# Patient Record
Sex: Male | Born: 1955 | Race: White | Hispanic: No | Marital: Married | Smoking: Former smoker
Health system: Southern US, Community
[De-identification: ages and names within clinical notes are randomized; demographics above are authoritative.]

## PROBLEM LIST (undated history)

## (undated) DIAGNOSIS — K219 Gastro-esophageal reflux disease without esophagitis: Secondary | ICD-10-CM

## (undated) DIAGNOSIS — E785 Hyperlipidemia, unspecified: Secondary | ICD-10-CM

## (undated) DIAGNOSIS — I10 Essential (primary) hypertension: Secondary | ICD-10-CM

## (undated) HISTORY — PX: TONSILLECTOMY: SUR1361

## (undated) HISTORY — DX: Hyperlipidemia, unspecified: E78.5

## (undated) HISTORY — DX: Essential (primary) hypertension: I10

---

## 2005-09-10 ENCOUNTER — Emergency Department: Payer: Self-pay | Admitting: Emergency Medicine

## 2007-01-04 HISTORY — PX: SHOULDER SURGERY: SHX246

## 2011-10-07 ENCOUNTER — Ambulatory Visit: Payer: Self-pay | Admitting: Medical

## 2011-10-07 LAB — DOT URINE DIP: Specific Gravity: 1.005 (ref 1.003–1.030)

## 2012-02-20 ENCOUNTER — Ambulatory Visit: Payer: Self-pay

## 2012-09-06 ENCOUNTER — Ambulatory Visit: Payer: Self-pay

## 2012-09-10 ENCOUNTER — Ambulatory Visit: Payer: Self-pay

## 2012-09-10 LAB — DOT URINE DIP
Protein: NEGATIVE
Specific Gravity: 1.02 (ref 1.003–1.030)

## 2013-08-19 ENCOUNTER — Ambulatory Visit: Payer: Self-pay | Admitting: Physician Assistant

## 2013-08-19 LAB — DOT URINE DIP
Blood: NEGATIVE
GLUCOSE, UR: NEGATIVE
Protein: NEGATIVE
Specific Gravity: 1.01 (ref 1.000–1.030)

## 2014-07-01 ENCOUNTER — Ambulatory Visit (INDEPENDENT_AMBULATORY_CARE_PROVIDER_SITE_OTHER): Payer: BLUE CROSS/BLUE SHIELD | Admitting: Unknown Physician Specialty

## 2014-07-01 ENCOUNTER — Encounter: Payer: Self-pay | Admitting: Unknown Physician Specialty

## 2014-07-01 VITALS — BP 163/95 | HR 53 | Temp 97.4°F | Ht 68.2 in | Wt 205.0 lb

## 2014-07-01 DIAGNOSIS — R7301 Impaired fasting glucose: Secondary | ICD-10-CM | POA: Insufficient documentation

## 2014-07-01 DIAGNOSIS — E785 Hyperlipidemia, unspecified: Secondary | ICD-10-CM

## 2014-07-01 DIAGNOSIS — N183 Chronic kidney disease, stage 3 unspecified: Secondary | ICD-10-CM | POA: Insufficient documentation

## 2014-07-01 DIAGNOSIS — K429 Umbilical hernia without obstruction or gangrene: Secondary | ICD-10-CM

## 2014-07-01 DIAGNOSIS — I159 Secondary hypertension, unspecified: Secondary | ICD-10-CM

## 2014-07-01 DIAGNOSIS — N529 Male erectile dysfunction, unspecified: Secondary | ICD-10-CM | POA: Insufficient documentation

## 2014-07-01 DIAGNOSIS — E8881 Metabolic syndrome: Secondary | ICD-10-CM

## 2014-07-01 DIAGNOSIS — I1310 Hypertensive heart and chronic kidney disease without heart failure, with stage 1 through stage 4 chronic kidney disease, or unspecified chronic kidney disease: Secondary | ICD-10-CM | POA: Insufficient documentation

## 2014-07-01 NOTE — Progress Notes (Signed)
   BP 163/95 mmHg  Pulse 53  Temp(Src) 97.4 F (36.3 C)  Ht 5' 8.2" (1.732 m)  Wt 205 lb (92.987 kg)  BMI 31.00 kg/m2  SpO2 99%   Subjective:    Patient ID: Brent Smith, male    DOB: 11/19/1955, 59 y.o.   MRN: [id number]  HPI: Brent Smith is a 59 y.o. male  Chief Complaint  Patient presents with  . Umbilical Hernia    pt states it has been there for about 5 weeks now, pt states he feels a little discomfort but not much pain   Pt with umbilical hernia that his wife is concerned about.  No bowel or bladder problems.  No pain other than when he presses on it.    Hypertension: Pt is having BP checked at home by his daughter.  It is generally runs 130's/80s.  Compliance is good  Relevant past medical, surgical, family and social history reviewed and updated as indicated. Interim medical history since our last visit reviewed. Allergies and medications reviewed and updated.  Review of Systems  Per HPI unless specifically indicated above     Objective:    BP 163/95 mmHg  Pulse 53  Temp(Src) 97.4 F (36.3 C)  Ht 5' 8.2" (1.732 m)  Wt 205 lb (92.987 kg)  BMI 31.00 kg/m2  SpO2 99%  Wt Readings from Last 3 Encounters:  07/01/14 205 lb (92.987 kg)  07/01/14 205 lb (92.987 kg)    Physical Exam  Constitutional: He is oriented to person, place, and time. He appears well-developed and well-nourished. No distress.  HENT:  Head: Normocephalic and atraumatic.  Eyes: Conjunctivae and lids are normal. Right eye exhibits no discharge. Left eye exhibits no discharge. No scleral icterus.  Cardiovascular: Normal rate and regular rhythm.   Pulmonary/Chest: Effort normal. No respiratory distress.  Abdominal: Soft. Normal appearance and bowel sounds are normal. He exhibits no distension. There is no splenomegaly or hepatomegaly. There is no tenderness.  Small ubilical hernia noted.    Musculoskeletal: Normal range of motion.  Neurological: He is alert and oriented to person,  place, and time.  Skin: Skin is intact. No rash noted. No pallor.  Psychiatric: He has a normal mood and affect. His behavior is normal. Judgment and thought content normal.       Assessment & Plan:   Problem List Items Addressed This Visit      Other   Hyperlipidemia    Home readings have been good       Other Visit Diagnoses    Umbilical hernia without obstruction and without gangrene    -  Primary        Follow up plan: Return if symptoms worsen or fail to improve.   Keep an eye on BP.  He states he will keep a regular check on it.   If persistantly high he will come back and see me.  Regular appointment in October.

## 2014-07-01 NOTE — Assessment & Plan Note (Signed)
Home readings have been good

## 2014-08-11 ENCOUNTER — Encounter: Payer: Self-pay | Admitting: Emergency Medicine

## 2014-08-11 ENCOUNTER — Ambulatory Visit
Admission: EM | Admit: 2014-08-11 | Discharge: 2014-08-11 | Disposition: A | Discharge status: Home / Self Care | Payer: Self-pay | Attending: Family Medicine | Admitting: Family Medicine

## 2014-08-11 DIAGNOSIS — E785 Hyperlipidemia, unspecified: Secondary | ICD-10-CM

## 2014-08-11 DIAGNOSIS — E669 Obesity, unspecified: Secondary | ICD-10-CM

## 2014-08-11 DIAGNOSIS — I10 Essential (primary) hypertension: Secondary | ICD-10-CM

## 2014-08-11 DIAGNOSIS — Z029 Encounter for administrative examinations, unspecified: Secondary | ICD-10-CM

## 2014-08-11 LAB — DEPT OF TRANSP DIPSTICK, URINE (ARMC ONLY)
Glucose, UA: NEGATIVE mg/dL
Hgb urine dipstick: NEGATIVE
Protein, ur: NEGATIVE mg/dL
Specific Gravity, Urine: 1.015 (ref 1.005–1.030)

## 2014-08-11 NOTE — ED Provider Notes (Signed)
CSN: [id number]     Arrival date & time 08/11/14  0754 History   First MD Initiated Contact with Patient 08/11/14 [phone]     Chief Complaint  Patient presents with  . 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  Saw PCM last month for umbilical hernia - resolved per patient denied pain, change in bowel habits.  Taking hypertension medications as prescribed and blood pressure has been good.  Next PCM follow up scheduled Oct 2015.  Has been on vacation at the beach this past week did not work out per his usual and gained 5 lbs.  Saw optometry last month and received new contact/glasses Rx on order minor adjustments.  Carries spare pair in truck with him  Works for [employer] typical shift 3 days on 3 off drives from [place] to [place], [place].  Denied headaches, chest pain, visual loss, seasonal allergies, dyspnea, shortness of breath.  Alcohol consumption 1-2 times per month 2-3 beers or 1 mixed drink per sitting when at their beach house not when on the road.  The history is provided by the patient.    Past Medical History  Diagnosis Date  . Hyperlipidemia   . Hypertension    Past Surgical History  Procedure Laterality Date  . Shoulder surgery  2009  . Tonsillectomy     Family History  Problem Relation Age of Onset  . Hypertension Mother   . Parkinson's disease Mother   . Hypertension Father   . Dementia Father   . Hypertension Sister   . Hypertension Brother   . Hypertension Maternal Grandfather    History  Substance Use Topics  . Smoking status: Former Games developer  . Smokeless tobacco: Never Used  . Alcohol Use: Yes     Comment: pt states on occasion or on vacation    Review of Systems  Constitutional: Negative for fever, chills, diaphoresis, activity change, appetite change and fatigue.  HENT: Negative for congestion, dental problem, drooling, ear  discharge, ear pain, facial swelling, hearing loss, mouth sores, nosebleeds, postnasal drip, rhinorrhea, sinus pressure, sneezing, sore throat, trouble swallowing and voice change.   Eyes: Negative for photophobia, pain, discharge, redness, itching and visual disturbance.  Respiratory: Negative for cough, choking, chest tightness, shortness of breath, wheezing and stridor.   Cardiovascular: Negative for chest pain, palpitations and leg swelling.  Gastrointestinal: Negative for nausea, vomiting, abdominal pain, diarrhea, constipation, blood in stool, abdominal distention and anal bleeding.  Endocrine: Negative for cold intolerance and heat intolerance.  Genitourinary: Negative for dysuria, urgency, frequency, hematuria, flank pain, decreased urine volume, enuresis, difficulty urinating and genital sores.  Musculoskeletal: Negative for myalgias, back pain, joint swelling, arthralgias, gait problem, neck pain and neck stiffness.  Skin: Negative for color change, pallor, rash and wound.  Allergic/Immunologic: Negative for environmental allergies and food allergies.  Neurological: Negative for dizziness, tremors, seizures, syncope, facial asymmetry, speech difficulty, weakness, light-headedness, numbness and headaches.  Hematological: Negative for adenopathy. Does not bruise/bleed easily.  Psychiatric/Behavioral: Negative for behavioral problems, confusion, sleep disturbance and agitation.    Allergies  Review of patient's allergies indicates no known allergies.  Home Medications   Prior to Admission medications   Medication Sig Start Date End Date Taking? Authorizing Provider  aspirin 81 MG tablet Take 81 mg by mouth daily.    Historical Provider, MD  atorvastatin (LIPITOR) 40 MG tablet Take 40 mg by mouth daily.    Historical Provider, MD  benazepril (  LOTENSIN) 10 MG tablet Take 10 mg by mouth daily.    Historical Provider, MD   BP 144/96 mmHg  Pulse 72  Temp(Src) 96.9 F (36.1 C)  (Tympanic)  Resp 18  Ht 5\' 9"  (1.753 m)  Wt 206 lb (93.441 kg)  BMI 30.41 kg/m2  SpO2 99% Physical Exam  Constitutional: He is oriented to person, place, and time. Vital signs are normal. He appears well-developed and well-nourished. No distress.  HENT:  Head: Normocephalic and atraumatic.  Right Ear: Hearing, tympanic membrane, external ear and ear canal normal.  Left Ear: Hearing, tympanic membrane, external ear and ear canal normal.  Nose: Nose normal. No mucosal edema or rhinorrhea. Right sinus exhibits no maxillary sinus tenderness and no frontal sinus tenderness. Left sinus exhibits no maxillary sinus tenderness and no frontal sinus tenderness.  Mouth/Throat: Uvula is midline and oropharynx is clear and moist. Mucous membranes are not pale, dry and not cyanotic. He does not have dentures. No oral lesions. No trismus in the jaw. Normal dentition. No dental abscesses, uvula swelling, lacerations or dental caries. No oropharyngeal exudate, posterior oropharyngeal edema, posterior oropharyngeal erythema or tonsillar abscesses.  Tongue white coating mucous membranes dry patient has been NPO this am  Eyes: Conjunctivae, EOM and lids are normal. Pupils are equal, round, and reactive to light. Right eye exhibits no chemosis, no discharge, no exudate and no hordeolum. No foreign body present in the right eye. Left eye exhibits no chemosis, no discharge, no exudate and no hordeolum. No foreign body present in the left eye. Right conjunctiva is not injected. Right conjunctiva has no hemorrhage. Left conjunctiva is not injected. Left conjunctiva has no hemorrhage. No scleral icterus. Right eye exhibits normal extraocular motion and no nystagmus. Left eye exhibits normal extraocular motion and no nystagmus.  Neck: Trachea normal and normal range of motion. Neck supple. No tracheal deviation present. No thyromegaly present.  Cardiovascular: Normal rate, regular rhythm, S1 normal, S2 normal, normal heart  sounds, intact distal pulses and normal pulses.  Exam reveals no gallop, no friction rub and no decreased pulses.   No murmur heard. Pulmonary/Chest: Effort normal and breath sounds normal. No stridor. No respiratory distress. He has no decreased breath sounds. He has no wheezes. He has no rhonchi. He has no rales. He exhibits no tenderness.  Abdominal: Soft. Bowel sounds are normal. He exhibits no shifting dullness, no distension, no pulsatile liver, no fluid wave, no abdominal bruit, no ascites, no pulsatile midline mass and no mass. There is no hepatosplenomegaly. There is no tenderness. There is no rigidity, no rebound, no guarding, no CVA tenderness, no tenderness at McBurney's point and negative Murphy's sign. Hernia confirmed negative in the ventral area, confirmed negative in the right inguinal area and confirmed negative in the left inguinal area.  Dull to percussion x 4 quads  Genitourinary: Penis normal. No penile tenderness.  Musculoskeletal: Normal range of motion. He exhibits no tenderness.       Right shoulder: Normal.       Left shoulder: Normal.       Right elbow: Normal.      Left elbow: Normal.       Right wrist: Normal.       Left wrist: Normal.       Right hip: Normal.       Left hip: Normal.       Right knee: Normal.       Left knee: Normal.       Right ankle: Normal.  Left ankle: Normal.       Cervical back: Normal.       Thoracic back: Normal.       Lumbar back: Normal.       Right upper arm: Normal.       Left upper arm: Normal.       Right forearm: Normal.       Left forearm: Normal.       Right hand: Normal.       Left hand: Normal.       Right upper leg: Normal.       Left upper leg: Normal.       Right lower leg: Normal.       Left lower leg: Normal.       Right foot: Normal.       Left foot: Normal.  Lymphadenopathy:    He has no cervical adenopathy.  Neurological: He is alert and oriented to person, place, and time. He has normal reflexes. He  displays normal reflexes. No cranial nerve deficit. He exhibits normal muscle tone. Coordination normal.  Skin: Skin is warm, dry and intact. No rash noted. He is not diaphoretic. No erythema. No pallor.  Psychiatric: He has a normal mood and affect. His speech is normal and behavior is normal. Judgment and thought content normal. Cognition and memory are normal.  Nursing note and vitals reviewed.   ED Course  Procedures (including critical care time) Labs Review Labs Reviewed  DEPT OF TRANSP DIPSTICK, URINE([facility] ONLY)    Imaging Review No results found. Repeat BP 141/78 hr 63  Patient hungry will go to eat breakfast and return for repeat blood pressure.  Discussed no tobacco, no caffeine, no added salt for breakfast food choices.  Patient verbalized understanding of information/instructions, agreed with plan of care and departed ambulatory at 0945 in NAD gait sure and steady in hallway.  Repeat bp at 1250 140/87 and 65 HR.  Discussed patient to lose 5 lbs over next month back to his baseline weight.  Follow up with [facility] as scheduled Oct 2016.  Patient verbalized understanding of information/instructions, agreed with plan of care and had no further questions at this time.   Visual Acuity  Right Eye Distance:  corrected20/20 Left Eye Distance: 20\20 corrected Bilateral Distance:  20/20 corrected  Right Eye Near:   Left Eye Near:    Bilateral Near:    MDM   1. Essential hypertension   2. Hyperlipidemia   3. Obesity (BMI 30-39.9)    Information entered in [employer] given 1 year certificate due to hypertension history.  See copies in record manager for [facility] 5875, [id number].  Patient instructed to follow up for re-evaluation in 1 year sooner if changes in his health status e.g. diagnosis chronic disease, surgery, hospitalization.  Discussed with patient urinalysis was normal.  Patient verbalized understanding of information/instructions, agreed with plan of care and  had no further questions at this time.  Continue current medications as directed.  Continue to monitor blood pressure at home and maintain log of blood pressure and pulse to bring to follow up appointments.  Continue low sodium diet and exercise program.  Recommended weight loss/weight maintenance to BMI 20-25.  5 lb weight loss will get him to overweight category and 35 lbs weight loss over next year will get him to normal weight category.  Discussed 150 minutes exercise per week continue gym membership.  Follow up as scheduled with PCM in October for routine re-evaluation HTN/hyperlipidemia.  Return  to the clinic if any new symptoms.  Patient verbalized agreement and understanding of treatment plan and had no further questions at this time.   P2:  Diet and Exercise specific for HTN   Barbaraann Barthel, NP 08/11/14 1304

## 2014-08-11 NOTE — ED Notes (Signed)
Dot physical 

## 2014-08-11 NOTE — Discharge Instructions (Signed)
Cholesterol Cholesterol is a white, waxy, fat-like substance needed by your body in small amounts. The liver makes all the cholesterol you need. Cholesterol is carried from the liver by the blood through the blood vessels. Deposits of cholesterol (plaque) may build up on blood vessel walls. These make the arteries narrower and stiffer. Cholesterol plaques increase the risk for heart attack and stroke.  You cannot feel your cholesterol level even if it is very high. The only way to know it is high is with a blood test. Once you know your cholesterol levels, you should keep a record of the test results. Work with your health care provider to keep your levels in the desired range.  WHAT DO THE RESULTS MEAN?  Total cholesterol is a rough measure of all the cholesterol in your blood.   LDL is the so-called bad cholesterol. This is the type that deposits cholesterol in the walls of the arteries. You want this level to be low.   HDL is the good cholesterol because it cleans the arteries and carries the LDL away. You want this level to be high.  Triglycerides are fat that the body can either burn for energy or store. High levels are closely linked to heart disease.  WHAT ARE THE DESIRED LEVELS OF CHOLESTEROL?  Total cholesterol below 200.   LDL below 100 for people at risk, below 70 for those at very high risk.   HDL above 50 is good, above 60 is best.   Triglycerides below 150.  HOW CAN I LOWER MY CHOLESTEROL?  Diet. Follow your diet programs as directed by your health care provider.   Choose fish or white meat chicken and [place], roasted or baked. Limit fatty cuts of red meat, fried foods, and processed meats, such as sausage and lunch meats.   Eat lots of fresh fruits and vegetables.  Choose whole grains, beans, pasta, potatoes, and cereals.   Use only small amounts of olive, corn, or canola oils.   Avoid butter, mayonnaise, shortening, or palm kernel oils.  Avoid foods with  trans fats.   Drink skim or nonfat milk and eat low-fat or nonfat yogurt and cheeses. Avoid whole milk, cream, ice cream, egg yolks, and full-fat cheeses.   Healthy desserts include angel food cake, ginger snaps, animal crackers, hard candy, popsicles, and low-fat or nonfat frozen yogurt. Avoid pastries, cakes, pies, and cookies.   Exercise. Follow your exercise programs as directed by your health care provider.   A regular program helps decrease LDL and raise HDL.   A regular program helps with weight control.   Do things that increase your activity level like gardening, walking, or taking the stairs. Ask your health care provider about how you can be more active in your daily life.   Medicine. Take medicine only as directed by your health care provider.   Medicine may be prescribed by your health care provider to help lower cholesterol and decrease the risk for heart disease.   If you have several risk factors, you may need medicine even if your levels are normal. Document Released: 09/14/2000 Document Revised: 05/06/2013 Document Reviewed: 10/03/2012 [facility] Patient Information 2015 [place], [place]. This information is not intended to replace advice given to you by your health care provider. Make sure you discuss any questions you have with your health care provider.  DASH Eating Plan DASH stands for "Dietary Approaches to Stop Hypertension." The DASH eating plan is a healthy eating plan that has been shown to reduce high  blood pressure (hypertension). Additional health benefits may include reducing the risk of type 2 diabetes mellitus, heart disease, and stroke. The DASH eating plan may also help with weight loss. WHAT DO I NEED TO KNOW ABOUT THE DASH EATING PLAN? For the DASH eating plan, you will follow these general guidelines:  Choose foods with a percent daily value for sodium of less than 5% (as listed on the food label).  Use salt-free seasonings or herbs instead of  table salt or sea salt.  Check with your health care provider or pharmacist before using salt substitutes.  Eat lower-sodium products, often labeled as "lower sodium" or "no salt added."  Eat fresh foods.  Eat more vegetables, fruits, and low-fat dairy products.  Choose whole grains. Look for the word "whole" as the first word in the ingredient list.  Choose fish and skinless chicken or [place] more often than red meat. Limit fish, poultry, and meat to 6 oz (170 g) each day.  Limit sweets, desserts, sugars, and sugary drinks.  Choose heart-healthy fats.  Limit cheese to 1 oz (28 g) per day.  Eat more home-cooked food and less restaurant, buffet, and fast food.  Limit fried foods.  Cook foods using methods other than frying.  Limit canned vegetables. If you do use them, rinse them well to decrease the sodium.  When eating at a restaurant, ask that your food be prepared with less salt, or no salt if possible. WHAT FOODS CAN I EAT? Seek help from a dietitian for individual calorie needs. Grains Whole grain or whole wheat bread. Brown rice. Whole grain or whole wheat pasta. Quinoa, bulgur, and whole grain cereals. Low-sodium cereals. Corn or whole wheat flour tortillas. Whole grain cornbread. Whole grain crackers. Low-sodium crackers. Vegetables Fresh or frozen vegetables (raw, steamed, roasted, or grilled). Low-sodium or reduced-sodium tomato and vegetable juices. Low-sodium or reduced-sodium tomato sauce and paste. Low-sodium or reduced-sodium canned vegetables.  Fruits All fresh, canned (in natural juice), or frozen fruits. Meat and Other Protein Products Ground beef (85% or leaner), grass-fed beef, or beef trimmed of fat. Skinless chicken or [place]. Ground chicken or [place]. Pork trimmed of fat. All fish and seafood. Eggs. Dried beans, peas, or lentils. Unsalted nuts and seeds. Unsalted canned beans. Dairy Low-fat dairy products, such as skim or 1% milk, 2% or reduced-fat  cheeses, low-fat ricotta or cottage cheese, or plain low-fat yogurt. Low-sodium or reduced-sodium cheeses. Fats and Oils Tub margarines without trans fats. Light or reduced-fat mayonnaise and salad dressings (reduced sodium). Avocado. Safflower, olive, or canola oils. Natural peanut or almond butter. Other Unsalted popcorn and pretzels. The items listed above may not be a complete list of recommended foods or beverages. Contact your dietitian for more options. WHAT FOODS ARE NOT RECOMMENDED? Grains White bread. White pasta. White rice. Refined cornbread. Bagels and croissants. Crackers that contain trans fat. Vegetables Creamed or fried vegetables. Vegetables in a cheese sauce. Regular canned vegetables. Regular canned tomato sauce and paste. Regular tomato and vegetable juices. Fruits Dried fruits. Canned fruit in light or heavy syrup. Fruit juice. Meat and Other Protein Products Fatty cuts of meat. Ribs, chicken wings, bacon, sausage, bologna, salami, chitterlings, fatback, hot dogs, bratwurst, and packaged luncheon meats. Salted nuts and seeds. Canned beans with salt. Dairy Whole or 2% milk, cream, half-and-half, and cream cheese. Whole-fat or sweetened yogurt. Full-fat cheeses or blue cheese. Nondairy creamers and whipped toppings. Processed cheese, cheese spreads, or cheese curds. Condiments Onion and garlic salt, seasoned salt, table salt, and  sea salt. Canned and packaged gravies. Worcestershire sauce. Tartar sauce. Barbecue sauce. Teriyaki sauce. Soy sauce, including reduced sodium. Steak sauce. Fish sauce. Oyster sauce. Cocktail sauce. Horseradish. Ketchup and mustard. Meat flavorings and tenderizers. Bouillon cubes. Hot sauce. Tabasco sauce. Marinades. Taco seasonings. Relishes. Fats and Oils Butter, stick margarine, lard, shortening, ghee, and bacon fat. Coconut, palm kernel, or palm oils. Regular salad dressings. Other Pickles and olives. Salted popcorn and pretzels. The items  listed above may not be a complete list of foods and beverages to avoid. Contact your dietitian for more information. WHERE CAN I FIND MORE INFORMATION? National Heart, Lung, and Blood Institute: [employer] Document Released: 12/09/2010 Document Revised: 05/06/2013 Document Reviewed: 10/24/2012 [facility] Patient Information 2015 [place], [place]. This information is not intended to replace advice given to you by your health care provider. Make sure you discuss any questions you have with your health care provider. Hypertension Hypertension, commonly called high blood pressure, is when the force of blood pumping through your arteries is too strong. Your arteries are the blood vessels that carry blood from your heart throughout your body. A blood pressure reading consists of a higher number over a lower number, such as 110/72. The higher number (systolic) is the pressure inside your arteries when your heart pumps. The lower number (diastolic) is the pressure inside your arteries when your heart relaxes. Ideally you want your blood pressure below 120/80. Hypertension forces your heart to work harder to pump blood. Your arteries may become narrow or stiff. Having hypertension puts you at risk for heart disease, stroke, and other problems.  RISK FACTORS Some risk factors for high blood pressure are controllable. Others are not.  Risk factors you cannot control include:   Race. You may be at higher risk if you are African American.  Age. Risk increases with age.  Gender. Men are at higher risk than women before age 12 years. After age 26, women are at higher risk than men. Risk factors you can control include:  Not getting enough exercise or physical activity.  Being overweight.  Getting too much fat, sugar, calories, or salt in your diet.  Drinking too much alcohol. SIGNS AND SYMPTOMS Hypertension does not usually cause signs or symptoms. Extremely high  blood pressure (hypertensive crisis) may cause headache, anxiety, shortness of breath, and nosebleed. DIAGNOSIS  To check if you have hypertension, your health care provider will measure your blood pressure while you are seated, with your arm held at the level of your heart. It should be measured at least twice using the same arm. Certain conditions can cause a difference in blood pressure between your right and left arms. A blood pressure reading that is higher than normal on one occasion does not mean that you need treatment. If one blood pressure reading is high, ask your health care provider about having it checked again. TREATMENT  Treating high blood pressure includes making lifestyle changes and possibly taking medicine. Living a healthy lifestyle can help lower high blood pressure. You may need to change some of your habits. Lifestyle changes may include:  Following the DASH diet. This diet is high in fruits, vegetables, and whole grains. It is low in salt, red meat, and added sugars.  Getting at least 2 hours of brisk physical activity every week.  Losing weight if necessary.  Not smoking.  Limiting alcoholic beverages.  Learning ways to reduce stress. If lifestyle changes are not enough to get your blood pressure under control, your health care provider may  prescribe medicine. You may need to take more than one. Work closely with your health care provider to understand the risks and benefits. HOME CARE INSTRUCTIONS  Have your blood pressure rechecked as directed by your health care provider.   Take medicines only as directed by your health care provider. Follow the directions carefully. Blood pressure medicines must be taken as prescribed. The medicine does not work as well when you skip doses. Skipping doses also puts you at risk for problems.   Do not smoke.   Monitor your blood pressure at home as directed by your health care provider. SEEK MEDICAL CARE IF:   You  think you are having a reaction to medicines taken.  You have recurrent headaches or feel dizzy.  You have swelling in your ankles.  You have trouble with your vision. SEEK IMMEDIATE MEDICAL CARE IF:  You develop a severe headache or confusion.  You have unusual weakness, numbness, or feel faint.  You have severe chest or abdominal pain.  You vomit repeatedly.  You have trouble breathing. MAKE SURE YOU:   Understand these instructions.  Will watch your condition.  Will get help right away if you are not doing well or get worse. Document Released: 12/20/2004 Document Revised: 05/06/2013 Document Reviewed: 10/12/2012 [facility] Patient Information 2015 [place], [place]. This information is not intended to replace advice given to you by your health care provider. Make sure you discuss any questions you have with your health care provider. Obesity Obesity is defined as having too much total body fat and a body mass index (BMI) of 30 or more. BMI is an estimate of body fat and is calculated from your height and weight. Obesity happens when you consume more calories than you can burn by exercising or performing daily physical tasks. Prolonged obesity can cause major illnesses or emergencies, such as:   Stroke.  Heart disease.  Diabetes.  Cancer.  Arthritis.  High blood pressure (hypertension).  High cholesterol.  Sleep apnea.  Erectile dysfunction.  Infertility problems. CAUSES   Regularly eating unhealthy foods.  Physical inactivity.  Certain disorders, such as an underactive thyroid (hypothyroidism), Cushing's syndrome, and polycystic ovarian syndrome.  Certain medicines, such as steroids, some depression medicines, and antipsychotics.  Genetics.  Lack of sleep. DIAGNOSIS  A health care provider can diagnose obesity after calculating your BMI. Obesity will be diagnosed if your BMI is 30 or higher.  There are other methods of measuring obesity levels. Some  other methods include measuring your skinfold thickness, your waist circumference, and comparing your hip circumference to your waist circumference. TREATMENT  A healthy treatment program includes some or all of the following:  Long-term dietary changes.  Exercise and physical activity.  Behavioral and lifestyle changes.  Medicine only under the supervision of your health care provider. Medicines may help, but only if they are used with diet and exercise programs. An unhealthy treatment program includes:  Fasting.  Fad diets.  Supplements and drugs. These choices do not succeed in long-term weight control.  HOME CARE INSTRUCTIONS   Exercise and perform physical activity as directed by your health care provider. To increase physical activity, try the following:  Use stairs instead of elevators.  Park farther away from store entrances.  Garden, bike, or walk instead of watching television or using the computer.  Eat healthy, low-calorie foods and drinks on a regular basis. Eat more fruits and vegetables. Use low-calorie cookbooks or take healthy cooking classes.  Limit fast food, sweets, and processed snack  foods.  Eat smaller portions.  Keep a daily journal of everything you eat. There are many free websites to help you with this. It may be helpful to measure your foods so you can determine if you are eating the correct portion sizes.  Avoid drinking alcohol. Drink more water and drinks without calories.  Take vitamins and supplements only as recommended by your health care provider.  Weight-loss support groups, Government social research officer, counselors, and stress reduction education can also be very helpful. SEEK IMMEDIATE MEDICAL CARE IF:  You have chest pain or tightness.  You have trouble breathing or feel short of breath.  You have weakness or leg numbness.  You feel confused or have trouble talking.  You have sudden changes in your vision. MAKE SURE  YOU:  Understand these instructions.  Will watch your condition.  Will get help right away if you are not doing well or get worse. Document Released: 01/28/2004 Document Revised: 05/06/2013 Document Reviewed: 01/26/2011 [facility] Patient Information 2015 [place], [place]. This information is not intended to replace advice given to you by your health care provider. Make sure you discuss any questions you have with your health care provider.

## 2014-12-01 ENCOUNTER — Other Ambulatory Visit: Payer: Self-pay

## 2014-12-01 MED ORDER — BENAZEPRIL HCL 10 MG PO TABS: 10.0000 mg | ORAL_TABLET | Freq: Every day | ORAL | Status: DC

## 2014-12-01 NOTE — Telephone Encounter (Signed)
Pt needs check further refills 

## 2014-12-01 NOTE — Telephone Encounter (Signed)
LAST VISIT: 07/01/2014 PHARMACY: [facility]  Request for Lotensin (benazepril) 10mg  tab.

## 2014-12-03 NOTE — Telephone Encounter (Signed)
Number that is on file is not patient's number or work. Number is for Sheliah HatchAl Alan? Will send patient a letter regarding appt for further refills.

## 2015-01-09 ENCOUNTER — Other Ambulatory Visit: Payer: Self-pay

## 2015-01-09 MED ORDER — BENAZEPRIL HCL 10 MG PO TABS: 10.0000 mg | ORAL_TABLET | Freq: Every day | ORAL | Status: DC

## 2015-01-09 NOTE — Telephone Encounter (Signed)
Called and scheduled patient an appointment for 01/16/15.

## 2015-01-09 NOTE — Telephone Encounter (Signed)
Patient was last seen 07/01/14 and Keri sent him a letter in November asking for patient to call to schedule a follow-up visit. Elnita MaxwellCheryl, please route this back to me so I can call the patient.

## 2015-01-16 ENCOUNTER — Ambulatory Visit: Payer: Self-pay | Admitting: Unknown Physician Specialty

## 2015-01-23 ENCOUNTER — Ambulatory Visit (INDEPENDENT_AMBULATORY_CARE_PROVIDER_SITE_OTHER): Payer: BLUE CROSS/BLUE SHIELD | Admitting: Unknown Physician Specialty

## 2015-01-23 ENCOUNTER — Encounter: Payer: Self-pay | Admitting: Unknown Physician Specialty

## 2015-01-23 VITALS — BP 143/92 | HR 66 | Temp 97.5°F | Ht 68.0 in | Wt 205.0 lb

## 2015-01-23 DIAGNOSIS — E8881 Metabolic syndrome: Secondary | ICD-10-CM | POA: Diagnosis not present

## 2015-01-23 DIAGNOSIS — I10 Essential (primary) hypertension: Secondary | ICD-10-CM | POA: Diagnosis not present

## 2015-01-23 DIAGNOSIS — Z0000 Encounter for general adult medical examination without abnormal findings: Secondary | ICD-10-CM

## 2015-01-23 DIAGNOSIS — E785 Hyperlipidemia, unspecified: Secondary | ICD-10-CM | POA: Diagnosis not present

## 2015-01-23 LAB — LIPID PANEL PICCOLO, WAIVED
CHOLESTEROL PICCOLO, WAIVED: 233 mg/dL — AB (ref <200)
Chol/HDL Ratio Piccolo,Waive: 5 mg/dL — ABNORMAL HIGH
HDL CHOL PICCOLO, WAIVED: 47 mg/dL — AB (ref >59)
LDL CHOL CALC PICCOLO WAIVED: 164 mg/dL — AB (ref <100)
Triglycerides Piccolo,Waived: 115 mg/dL (ref <150)
VLDL Chol Calc Piccolo,Waive: 23 mg/dL (ref <30)

## 2015-01-23 LAB — MICROALBUMIN, URINE WAIVED
CREATININE, URINE WAIVED: 200 mg/dL (ref 10–300)
MICROALB, UR WAIVED: 30 mg/L — AB (ref 0–19)
Microalb/Creat Ratio: <30 mg/g (ref <30)

## 2015-01-23 LAB — BAYER DCA HB A1C WAIVED: HB A1C (BAYER DCA - WAIVED): 5.9 % (ref <7.0)

## 2015-01-23 MED ORDER — BENAZEPRIL HCL 20 MG PO TABS: 20.0000 mg | ORAL_TABLET | Freq: Every day | ORAL | Status: DC

## 2015-01-23 NOTE — Assessment & Plan Note (Signed)
hgb A1C is 5.9.  Encouraged diet and exercise.

## 2015-01-23 NOTE — Progress Notes (Signed)
+-----------bbb  BP 143/92 mmHg  Pulse 66  Temp(Src) 97.5 F (36.4 C)  Ht  (1.727 m)  Wt 205 lb (92.987 kg)  BMI 31.18 kg/m2  SpO2 96%   Subjective:    Patient ID: Brent Smith, male    DOB: [birth year], 61 y.o.   MRN: [id number]  HPI: Brent Smith is a 60 y.o. male  Chief Complaint  Patient presents with  . Hyperlipidemia  . Hypertension   Hypertension Using medications without difficulty Average home BPs "sometimes good and sometimes what they are today  No problems or lightheadedness No chest pain with exertion or shortness of breath No Edema   Hyperlipidemia Not taking Lipitor well Using medications without problems: No Muscle aches  Diet compliance: good Exercise: good until the holicays   Relevant past medical, surgical, family and social history reviewed and updated as indicated. Interim medical history since our last visit reviewed. Allergies and medications reviewed and updated.  Review of Systems  Per HPI unless specifically indicated above     Objective:    BP 143/92 mmHg  Pulse 66  Temp(Src) 97.5 F (36.4 C)  Ht  (1.727 m)  Wt 205 lb (92.987 kg)  BMI 31.18 kg/m2  SpO2 96%  Wt Readings from Last 3 Encounters:  01/23/15 205 lb (92.987 kg)  08/11/14 206 lb (93.441 kg)  07/01/14 205 lb (92.987 kg)    Physical Exam  Constitutional: He is oriented to person, place, and time. He appears well-developed and well-nourished. No distress.  HENT:  Head: Normocephalic and atraumatic.  Eyes: Conjunctivae and lids are normal. Right eye exhibits no discharge. Left eye exhibits no discharge. No scleral icterus.  Neck: Normal range of motion. Neck supple. No JVD present. Carotid bruit is not present.  Cardiovascular: Normal rate, regular rhythm and normal heart sounds.   Pulmonary/Chest: Effort normal and breath sounds normal. No respiratory distress.  Abdominal: Normal appearance. There is no splenomegaly or hepatomegaly.   Musculoskeletal: Normal range of motion.  Neurological: He is alert and oriented to person, place, and time.  Skin: Skin is warm, dry and intact. No rash noted. No pallor.  Psychiatric: He has a normal mood and affect. His behavior is normal. Judgment and thought content normal.    Results for orders placed or performed during the hospital encounter of 08/11/14  Dept of Transp dipstick, urine  Result Value Ref Range   Protein, ur NEGATIVE NEGATIVE mg/dL   Glucose, UA NEGATIVE NEGATIVE mg/dL   Specific Gravity, Urine 1.015 1.005 - 1.030   Hgb urine dipstick NEGATIVE NEGATIVE      Assessment & Plan:   Problem List Items Addressed This Visit      Unprioritized   Hypertension - Primary    Not to goal. Increase Lisinopril to 20 mgs      Relevant Medications   benazepril (LOTENSIN) 20 MG tablet   Other Relevant Orders   Bayer DCA Hb A1c Waived   Microalbumin, Urine Waived   Uric acid   Hyperlipidemia    LDL 167.  Improve comliance      Relevant Medications   benazepril (LOTENSIN) 20 MG tablet   Other Relevant Orders   Lipid Panel Piccolo, Waived   Metabolic syndrome    hgb A1C is 5.9.  Encouraged diet and exercise.         Other Visit Diagnoses    Routine general medical examination at a health care facility        Relevant Orders  Hepatitis C antibody    HIV antibody        Follow up plan: Return in about 6 months (around 07/23/2015) for physical.

## 2015-01-23 NOTE — Assessment & Plan Note (Signed)
Not to goal. Increase Lisinopril to 20 mgs

## 2015-01-23 NOTE — Assessment & Plan Note (Signed)
LDL 167.  Improve comliance

## 2015-01-24 LAB — HEPATITIS C ANTIBODY

## 2015-01-24 LAB — HIV ANTIBODY (ROUTINE TESTING W REFLEX): HIV Screen 4th Generation wRfx: NONREACTIVE

## 2015-01-24 LAB — URIC ACID: URIC ACID: 5.8 mg/dL (ref 3.7–8.6)

## 2015-05-01 ENCOUNTER — Other Ambulatory Visit: Payer: Self-pay

## 2015-05-01 MED ORDER — ATORVASTATIN CALCIUM 40 MG PO TABS: 40.0000 mg | ORAL_TABLET | Freq: Every day | ORAL | Status: DC

## 2015-05-01 NOTE — Telephone Encounter (Signed)
Patient was seen 01/23/15 and has f/u scheduled 07/31/15. Pharmacy is [facility].

## 2015-07-31 ENCOUNTER — Ambulatory Visit
Admission: EM | Admit: 2015-07-31 | Discharge: 2015-07-31 | Disposition: A | Discharge status: Home / Self Care | Payer: Self-pay | Attending: Family Medicine | Admitting: Family Medicine

## 2015-07-31 ENCOUNTER — Encounter: Payer: Self-pay | Admitting: Emergency Medicine

## 2015-07-31 ENCOUNTER — Encounter: Payer: BLUE CROSS/BLUE SHIELD | Admitting: Unknown Physician Specialty

## 2015-07-31 DIAGNOSIS — Z029 Encounter for administrative examinations, unspecified: Secondary | ICD-10-CM

## 2015-07-31 DIAGNOSIS — Z024 Encounter for examination for driving license: Secondary | ICD-10-CM

## 2015-07-31 LAB — DEPT OF TRANSP DIPSTICK, URINE (ARMC ONLY)
Glucose, UA: NEGATIVE mg/dL
Hgb urine dipstick: NEGATIVE
Protein, ur: NEGATIVE mg/dL
Specific Gravity, Urine: 1.01 (ref 1.005–1.030)

## 2015-07-31 NOTE — ED Provider Notes (Signed)
CSN: [id number]     Arrival date & time 07/31/15  9021 History   None    Chief Complaint  Patient presents with  . 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 He has a history of hypertension and hypercholesterolemia. His only surgery was a right shoulder surgery in 2008. He wears corrective lenses while driving.  Past Medical History:  Diagnosis Date  . Hyperlipidemia   . Hypertension    Past Surgical History:  Procedure Laterality Date  . SHOULDER SURGERY  2009  . TONSILLECTOMY     Family History  Problem Relation Age of Onset  . Hypertension Mother   . Parkinson's disease Mother   . Hypertension Father   . Dementia Father   . Hypertension Sister   . Hypertension Brother   . Hypertension Maternal Grandfather    Social History  Substance Use Topics  . Smoking status: Former Games developer  . Smokeless tobacco: Never Used  . Alcohol use Yes     Comment: pt states on occasion or on vacation    Review of Systems  All other systems reviewed and are negative.   Allergies  Review of patient's allergies indicates no known allergies.  Home Medications   Prior to Admission medications   Medication Sig Start Date End Date Taking? Authorizing Provider  aspirin 81 MG tablet Take 81 mg by mouth daily.    Historical Provider, MD  atorvastatin (LIPITOR) 40 MG tablet Take 1 tablet (40 mg total) by mouth daily. 05/01/15   Gabriel Cirri, NP  benazepril (LOTENSIN) 20 MG tablet Take 1 tablet (20 mg total) by mouth daily. 01/23/15   Gabriel Cirri, NP   Meds Ordered and Administered this Visit  Medications - No data to display  BP 138/89 (BP Location: Right Arm)   Pulse 67   Temp 97.6 F (36.4 C) (Tympanic)   Resp 16   Ht 5\' 8"  (1.727 m)   Wt 209 lb (94.8 kg)   SpO2 100%   BMI 31.78 kg/m  No data found.   Physical  Exam  Constitutional:  Refer to DOT physical sheet  Nursing note and vitals reviewed.   Urgent Care Course   Clinical Course    Procedures (including critical care time)  Labs Review Labs Reviewed  DEPT OF TRANSP DIPSTICK, URINE([facility] ONLY)    Imaging Review No results found.   Visual Acuity Review  Right Eye Distance: 20/20 corrected Left Eye Distance: 20/20 corrected Bilateral Distance: 20/20 corrected  Right Eye Near:   Left Eye Near:    Bilateral Near:         MDM   1. Driver's permit PE (physical examination)    Patient qualifies for one year certificate due to his hypertension on medication. He also must drive with corrective lenses.    Lutricia Feil, PA-C 07/31/15 [phone]

## 2015-07-31 NOTE — ED Triage Notes (Signed)
Patient here for DOT Physical.  

## 2015-08-31 ENCOUNTER — Other Ambulatory Visit: Payer: Self-pay | Admitting: Unknown Physician Specialty

## 2015-08-31 NOTE — Telephone Encounter (Signed)
Your patient 

## 2015-08-31 NOTE — Telephone Encounter (Signed)
Patient called stating he needs a refill on benazepril. I looked at last note and see he was supposed to be seen for physical in July. Patient requested appointment on a Monday or Friday so I scheduled his physical for 08/26/15. Patient asked if we could send in enough medication to get to that appointment. Pharmacy is [facility].

## 2015-10-26 ENCOUNTER — Ambulatory Visit
Admission: EM | Admit: 2015-10-26 | Discharge: 2015-10-26 | Disposition: A | Discharge status: Home / Self Care | Payer: Worker's Compensation | Attending: Family Medicine | Admitting: Family Medicine

## 2015-10-26 ENCOUNTER — Ambulatory Visit (INDEPENDENT_AMBULATORY_CARE_PROVIDER_SITE_OTHER): Payer: BLUE CROSS/BLUE SHIELD | Admitting: Unknown Physician Specialty

## 2015-10-26 ENCOUNTER — Encounter: Payer: Self-pay | Admitting: *Deleted

## 2015-10-26 ENCOUNTER — Ambulatory Visit (INDEPENDENT_AMBULATORY_CARE_PROVIDER_SITE_OTHER): Payer: Worker's Compensation

## 2015-10-26 ENCOUNTER — Encounter: Payer: Self-pay | Admitting: Unknown Physician Specialty

## 2015-10-26 VITALS — BP 144/70 | HR 73 | Temp 97.7°F | Ht 67.6 in | Wt 197.8 lb

## 2015-10-26 DIAGNOSIS — Z23 Encounter for immunization: Secondary | ICD-10-CM | POA: Diagnosis not present

## 2015-10-26 DIAGNOSIS — E7800 Pure hypercholesterolemia, unspecified: Secondary | ICD-10-CM

## 2015-10-26 DIAGNOSIS — S39012A Strain of muscle, fascia and tendon of lower back, initial encounter: Secondary | ICD-10-CM

## 2015-10-26 DIAGNOSIS — I10 Essential (primary) hypertension: Secondary | ICD-10-CM

## 2015-10-26 DIAGNOSIS — Z0000 Encounter for general adult medical examination without abnormal findings: Secondary | ICD-10-CM

## 2015-10-26 DIAGNOSIS — S300XXA Contusion of lower back and pelvis, initial encounter: Secondary | ICD-10-CM

## 2015-10-26 MED ORDER — CYCLOBENZAPRINE HCL 10 MG PO TABS: 10.0000 mg | ORAL_TABLET | Freq: Every day | ORAL | 0 refills | Status: DC

## 2015-10-26 MED ORDER — HYDROCODONE-ACETAMINOPHEN 5-325 MG PO TABS: ORAL_TABLET | ORAL | 0 refills | Status: DC

## 2015-10-26 NOTE — Assessment & Plan Note (Signed)
Check lipid panel  

## 2015-10-26 NOTE — Assessment & Plan Note (Signed)
Stable, continue present medications.   

## 2015-10-26 NOTE — Progress Notes (Signed)
BP (!) 144/70 (BP Location: Left Arm, Patient Position: Sitting, Cuff Size: Large)   Pulse 73   Temp 97.7 F (36.5 C)   Ht 5' 7.6" (1.717 m)   Wt 197 lb 12.8 oz (89.7 kg)   SpO2 97%   BMI 30.43 kg/m    Subjective:    Patient ID: Brent Smith, male    DOB: 06/13/1955, 60 y.o.   MRN: [id number]  HPI: Brent Smith is a 60 y.o. male  Chief Complaint  Patient presents with  . Annual Exam    pt states he had a fall last week at work and would like his back looked at    Social History   Social History  . Marital status: Married    Spouse name: N/A  . Number of children: N/A  . Years of education: N/A   Occupational History  . Not on file.   Social History Main Topics  . Smoking status: Former Games developermoker  . Smokeless tobacco: Never Used  . Alcohol use Yes     Comment: pt states on occasion or on vacation  . Drug use: No  . Sexual activity: Yes   Other Topics Concern  . Not on file   Social History Narrative  . No narrative on file   Family History  Problem Relation Age of Onset  . Hypertension Mother   . Parkinson's disease Mother   . Hypertension Father   . Dementia Father   . Hypertension Sister   . Hypertension Brother   . Hypertension Maternal Grandfather    Past Medical History:  Diagnosis Date  . Hyperlipidemia   . Hypertension    Past Surgical History:  Procedure Laterality Date  . SHOULDER SURGERY  2009  . TONSILLECTOMY     Back injury Tripped on a palate and hip his right lower back on a palate edge.    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 Interim medical history since our last visit reviewed. Allergies and medications reviewed and updated.  Review of Systems  Per HPI unless specifically indicated above     Objective:    BP (!) 144/70 (BP Location: Left Arm, Patient Position: Sitting, Cuff Size: Large)   Pulse 73   Temp 97.7 F (36.5 C)   Ht 5' 7.6" (1.717 m)   Wt 197 lb 12.8 oz (89.7 kg)   SpO2 97%   BMI 30.43 kg/m   Wt Readings from Last 3 Encounters:  10/26/15 197 lb 12.8 oz (89.7 kg)  07/31/15 209 lb (94.8 kg)  01/23/15 205 lb (93 kg)    Physical Exam  Constitutional: He is oriented to person, place, and time. He appears well-developed and well-nourished.  HENT:  Head: Normocephalic.  Right Ear: Tympanic membrane, external ear and ear canal normal.  Left Ear: Tympanic membrane, external ear and ear canal normal.  Mouth/Throat: Uvula is midline, oropharynx is clear and moist and mucous membranes are normal.  Eyes: Pupils are equal, round, and reactive to light.  Cardiovascular: Normal rate, regular rhythm and normal heart sounds.  Exam reveals  no gallop and no friction rub.   No murmur heard. Pulmonary/Chest: Effort normal and breath sounds normal. No respiratory distress.  Abdominal: Soft. Bowel sounds are normal. He exhibits no distension. There is no tenderness.  Genitourinary: Rectum normal and prostate normal.  Musculoskeletal: Normal range of motion.  Neurological: He is alert and oriented to person, place, and time. He has normal reflexes.  Skin: Skin is warm and dry.  Psychiatric: He has a normal mood and affect. His behavior is normal. Judgment and thought content normal.    Results for orders placed or performed during the hospital encounter of 07/31/15  Dept of Transp dipstick, urine  Result Value Ref Range   Protein, ur NEGATIVE NEGATIVE mg/dL   Glucose, UA NEGATIVE NEGATIVE mg/dL   Specific Gravity, Urine 1.010 1.005 - 1.030    Hgb urine dipstick NEGATIVE NEGATIVE      Assessment & Plan:   Problem List Items Addressed This Visit      Unprioritized   Hyperlipidemia    Check lipid panel      Hypertension    Stable, continue present medications.         Other Visit Diagnoses    Need for diphtheria-tetanus-pertussis (Tdap) vaccine, adult/adolescent    -  Primary   Relevant Orders   Tdap vaccine greater than or equal to 7yo IM (Completed)   Annual physical exam       Relevant Orders   CBC with Differential/Platelet   Comprehensive metabolic panel   Lipid Panel w/o Chol/HDL Ratio   TSH   PSA    -   Follow up plan: Return in about 6 months (around 04/25/2016).

## 2015-10-26 NOTE — ED Provider Notes (Signed)
[facility]    CSN: [id number] Arrival date & time: 10/26/15  1523     History   Chief Complaint Chief Complaint  Patient presents with  . Back Pain    HPI Brent Smith is a 60 y.o. male.   60 yo male with a c/o low back pain for 5 days after falling at work (5 days ago). States he landed on a pallet. He continued working after accident, but pain has continued intermittently. Denies any numbness/tingling.    The history is provided by the patient.    Past Medical History:  Diagnosis Date  . Hyperlipidemia   . Hypertension     Patient Active Problem List   Diagnosis Date Noted  . Hypertension 07/01/2014  . Hyperlipidemia 07/01/2014  . Erectile dysfunction 07/01/2014  . Impaired fasting blood sugar 07/01/2014  . Metabolic syndrome 07/01/2014    Past Surgical History:  Procedure Laterality Date  . SHOULDER SURGERY  2009  . TONSILLECTOMY         Home Medications    Prior to Admission medications   Medication Sig Start Date End Date Taking? Authorizing Provider  aspirin 81 MG tablet Take 81 mg by mouth daily.   Yes Historical Provider, MD  atorvastatin (LIPITOR) 40 MG tablet TAKE 1 TABLET BY MOUTH AT BEDTIME 08/31/15  Yes Gabriel Cirri, NP  benazepril (LOTENSIN) 20 MG tablet Take 1 tablet (20 mg total) by mouth daily. 08/31/15  Yes Gabriel Cirri, NP  cyclobenzaprine (FLEXERIL) 10 MG tablet Take 1 tablet (10 mg total) by mouth at bedtime. 10/26/15   Payton Mccallum, MD  HYDROcodone-acetaminophen (NORCO/VICODIN) 5-325 MG tablet 1-2 tabs po q 8 hours prn severe pain 10/26/15   Payton Mccallum, MD    Family History Family History  Problem Relation Age of Onset  . Hypertension Mother   . Parkinson's disease Mother   . Hypertension Father   . Dementia Father   . Hypertension Sister   . Hypertension Brother   . Hypertension Maternal Grandfather     Social History Social History  Substance Use Topics  . Smoking status: Former Games developer  .  Smokeless tobacco: Never Used  . Alcohol use Yes     Comment: pt states on occasion or on vacation     Allergies   Review of patient's allergies indicates no known allergies.   Review of Systems Review of Systems   Physical Exam Triage Vital Signs ED Triage Vitals  Enc Vitals Group     BP 10/26/15 1603 (!) 139/100     Pulse Rate 10/26/15 1603 68     Resp 10/26/15 1603 16     Temp 10/26/15 1603 97.6 F (36.4 C)     Temp Source 10/26/15 1603 Oral     SpO2 10/26/15 1603 100 %     Weight 10/26/15 1605 197 lb (89.4 kg)     Height 10/26/15 1605 5\' 8"  (1.727 m)     Head Circumference --      Peak Flow --      Pain Score --      Pain Loc --      Pain Edu? --      Excl. in GC? --    No data found.   Updated Vital Signs BP (!) 139/100 (BP Location: Left Arm)   Pulse 68   Temp 97.6 F (36.4 C) (Oral)   Resp 16   Ht 5\' 8"  (1.727 m)   Wt 197 lb (89.4 kg)  SpO2 100%   BMI 29.95 kg/m   Visual Acuity Right Eye Distance:   Left Eye Distance:   Bilateral Distance:    Right Eye Near:   Left Eye Near:    Bilateral Near:     Physical Exam  Constitutional: He appears well-developed and well-nourished. No distress.  Neck: Normal range of motion. Neck supple. No tracheal deviation present.  Pulmonary/Chest: Effort normal. No stridor. No respiratory distress.  Musculoskeletal:       Cervical back: He exhibits normal range of motion, no tenderness, no bony tenderness, no swelling, no edema, no deformity, no laceration, no pain, no spasm and normal pulse.       Lumbar back: He exhibits tenderness and spasm. He exhibits normal range of motion, no bony tenderness, no swelling, no edema, no deformity, no laceration, no pain and normal pulse.  Neurological: He is alert. He has normal reflexes. He displays normal reflexes. He exhibits normal muscle tone. Coordination normal.  Skin: No rash noted. He is not diaphoretic.  Nursing note and vitals reviewed.    UC Treatments /  Results  Labs (all labs ordered are listed, but only abnormal results are displayed) Labs Reviewed - No data to display  EKG  EKG Interpretation None       Radiology No results found.  Procedures Procedures (including critical care time)  Medications Ordered in UC Medications - No data to display   Initial Impression / Assessment and Plan / UC Course  I have reviewed the triage vital signs and the nursing notes.  Pertinent labs & imaging results that were available during my care of the patient were reviewed by me and considered in my medical decision making (see chart for details).  Clinical Course      Final Clinical Impressions(s) / UC Diagnoses   Final diagnoses:  Strain of lumbar region, initial encounter  Contusion of lower back, initial encounter    New Prescriptions Discharge Medication List as of 10/26/2015  5:23 PM    START taking these medications   Details  cyclobenzaprine (FLEXERIL) 10 MG tablet Take 1 tablet (10 mg total) by mouth at bedtime., Starting Mon 10/26/2015, Print    HYDROcodone-acetaminophen (NORCO/VICODIN) 5-325 MG tablet 1-2 tabs po q 8 hours prn severe pain, Print       1. x-ray results and diagnosis reviewed with patient 2. rx as per orders above; reviewed possible side effects, interactions, risks and benefits  3. Recommend supportive treatment with rest, heat, gentle stretching, work restrictions as per form 4. Follow-up in 1 week for recheck   Payton Mccallumrlando Trichelle Lehan, MD 11/02/15 1320

## 2015-10-26 NOTE — Patient Instructions (Signed)
Tdap Vaccine (Tetanus, Diphtheria and Pertussis): What You Need to Know 1. Why get vaccinated? Tetanus, diphtheria and pertussis are very serious diseases. Tdap vaccine can protect us from these diseases. And, Tdap vaccine given to pregnant women can protect newborn babies against pertussis. TETANUS (Lockjaw) is rare in the [place] today. It causes painful muscle tightening and stiffness, usually all over the body.  It can lead to tightening of muscles in the head and neck so you can't open your mouth, swallow, or sometimes even breathe. Tetanus kills about 1 out of 10 people who are infected even after receiving the best medical care. DIPHTHERIA is also rare in the [place] today. It can cause a thick coating to form in the back of the throat.  It can lead to breathing problems, heart failure, paralysis, and death. PERTUSSIS (Whooping Cough) causes severe coughing spells, which can cause difficulty breathing, vomiting and disturbed sleep.  It can also lead to weight loss, incontinence, and rib fractures. Up to 2 in 100 adolescents and 5 in 100 adults with pertussis are hospitalized or have complications, which could include pneumonia or death. These diseases are caused by bacteria. Diphtheria and pertussis are spread from person to person through secretions from coughing or sneezing. Tetanus enters the body through cuts, scratches, or wounds. Before vaccines, as many as 200,000 cases of diphtheria, 200,000 cases of pertussis, and hundreds of cases of tetanus, were reported in the [place] each year. Since vaccination began, reports of cases for tetanus and diphtheria have dropped by about 99% and for pertussis by about 80%. 2. Tdap vaccine Tdap vaccine can protect adolescents and adults from tetanus, diphtheria, and pertussis. One dose of Tdap is routinely given at age 11 or 12. People who did not get Tdap at that age should get it as soon as possible. Tdap is especially important  for healthcare professionals and anyone having close contact with a baby younger than 12 months. Pregnant women should get a dose of Tdap during every pregnancy, to protect the newborn from pertussis. Infants are most at risk for severe, life-threatening complications from pertussis. Another vaccine, called Td, protects against tetanus and diphtheria, but not pertussis. A Td booster should be given every 10 years. Tdap may be given as one of these boosters if you have never gotten Tdap before. Tdap may also be given after a severe cut or burn to prevent tetanus infection. Your doctor or the person giving you the vaccine can give you more information. Tdap may safely be given at the same time as other vaccines. 3. Some people should not get this vaccine  A person who has ever had a life-threatening allergic reaction after a previous dose of any diphtheria, tetanus or pertussis containing vaccine, OR has a severe allergy to any part of this vaccine, should not get Tdap vaccine. Tell the person giving the vaccine about any severe allergies.  Anyone who had coma or long repeated seizures within 7 days after a childhood dose of DTP or DTaP, or a previous dose of Tdap, should not get Tdap, unless a cause other than the vaccine was found. They can still get Td.  Talk to your doctor if you:  have seizures or another nervous system problem,  had severe pain or swelling after any vaccine containing diphtheria, tetanus or pertussis,  ever had a condition called Guillain-Barr Syndrome (GBS),  aren't feeling well on the day the shot is scheduled. 4. Risks With any medicine, including vaccines, there is   a chance of side effects. These are usually mild and go away on their own. Serious reactions are also possible but are rare. Most people who get Tdap vaccine do not have any problems with it. Mild problems following Tdap (Did not interfere with activities)  Pain where the shot was given (about 3 in 4  adolescents or 2 in 3 adults)  Redness or swelling where the shot was given (about 1 person in 5)  Mild fever of at least 100.4F (up to about 1 in 25 adolescents or 1 in 100 adults)  Headache (about 3 or 4 people in 10)  Tiredness (about 1 person in 3 or 4)  Nausea, vomiting, diarrhea, stomach ache (up to 1 in 4 adolescents or 1 in 10 adults)  Chills, sore joints (about 1 person in 10)  Body aches (about 1 person in 3 or 4)  Rash, swollen glands (uncommon) Moderate problems following Tdap (Interfered with activities, but did not require medical attention)  Pain where the shot was given (up to 1 in 5 or 6)  Redness or swelling where the shot was given (up to about 1 in 16 adolescents or 1 in 12 adults)  Fever over 102F (about 1 in 100 adolescents or 1 in 250 adults)  Headache (about 1 in 7 adolescents or 1 in 10 adults)  Nausea, vomiting, diarrhea, stomach ache (up to 1 or 3 people in 100)  Swelling of the entire arm where the shot was given (up to about 1 in 500). Severe problems following Tdap (Unable to perform usual activities; required medical attention)  Swelling, severe pain, bleeding and redness in the arm where the shot was given (rare). Problems that could happen after any vaccine:  People sometimes faint after a medical procedure, including vaccination. Sitting or lying down for about 15 minutes can help prevent fainting, and injuries caused by a fall. Tell your doctor if you feel dizzy, or have vision changes or ringing in the ears.  Some people get severe pain in the shoulder and have difficulty moving the arm where a shot was given. This happens very rarely.  Any medication can cause a severe allergic reaction. Such reactions from a vaccine are very rare, estimated at fewer than 1 in a million doses, and would happen within a few minutes to a few hours after the vaccination. As with any medicine, there is a very remote chance of a vaccine causing a serious  injury or death. The safety of vaccines is always being monitored. For more information, visit: www.cdc.gov/vaccinesafety/ 5. What if there is a serious problem? What should I look for?  Look for anything that concerns you, such as signs of a severe allergic reaction, very high fever, or unusual behavior.  Signs of a severe allergic reaction can include hives, swelling of the face and throat, difficulty breathing, a fast heartbeat, dizziness, and weakness. These would usually start a few minutes to a few hours after the vaccination. What should I do?  If you think it is a severe allergic reaction or other emergency that can't wait, call 9-1-1 or get the person to the nearest hospital. Otherwise, call your doctor.  Afterward, the reaction should be reported to the Vaccine Adverse Event Reporting System (VAERS). Your doctor might file this report, or you can do it yourself through the VAERS web site at www.vaers.hhs.gov, or by calling 1-800-822-7967. VAERS does not give medical advice.  6. The National Vaccine Injury Compensation Program The National Vaccine Injury Compensation Program (  VICP) is a federal program that was created to compensate people who may have been injured by certain vaccines. Persons who believe they may have been injured by a vaccine can learn about the program and about filing a claim by calling 1-800-338-2382 or visiting the VICP website at www.hrsa.gov/vaccinecompensation. There is a time limit to file a claim for compensation. 7. How can I learn more?  Ask your doctor. He or she can give you the vaccine package insert or suggest other sources of information.  Call your local or state health department.  Contact the Centers for Disease Control and Prevention (CDC):  Call 1-800-232-4636 (1-800-CDC-INFO) or  Visit CDC's website at www.cdc.gov/vaccines CDC Tdap Vaccine VIS (02/26/13)   This information is not intended to replace advice given to you by your health care  provider. Make sure you discuss any questions you have with your health care provider.   Document Released: 06/21/2011 Document Revised: 01/10/2014 Document Reviewed: 04/03/2013 Elsevier Interactive Patient Education 2016 Elsevier Inc.  

## 2015-10-26 NOTE — ED Triage Notes (Signed)
Pt tripped and fell last Thursday at work. Landed on a pallet on his right LS spine region. Had immediate LS spine region pain but continued to work after accident thinking he was ok. Pain has been intermittent throughout weekend but worse today.

## 2015-10-27 ENCOUNTER — Encounter: Payer: Self-pay | Admitting: Unknown Physician Specialty

## 2015-10-27 LAB — LIPID PANEL W/O CHOL/HDL RATIO
Cholesterol, Total: 158 mg/dL (ref 100–199)
HDL: 47 mg/dL (ref >39)
LDL Calculated: 89 mg/dL (ref 0–99)
Triglycerides: 108 mg/dL (ref 0–149)
VLDL Cholesterol Cal: 22 mg/dL (ref 5–40)

## 2015-10-27 LAB — COMPREHENSIVE METABOLIC PANEL
A/G RATIO: 1.8 (ref 1.2–2.2)
ALT: 15 IU/L (ref 0–44)
AST: 12 IU/L (ref 0–40)
Albumin: 4.4 g/dL (ref 3.6–4.8)
Alkaline Phosphatase: 55 IU/L (ref 39–117)
BUN/Creatinine Ratio: 12 (ref 10–24)
BUN: 13 mg/dL (ref 8–27)
Bilirubin Total: 0.6 mg/dL (ref 0.0–1.2)
CALCIUM: 9.2 mg/dL (ref 8.6–10.2)
CO2: 24 mmol/L (ref 18–29)
Chloride: 104 mmol/L (ref 96–106)
Creatinine, Ser: 1.07 mg/dL (ref 0.76–1.27)
GFR, EST AFRICAN AMERICAN: 86 mL/min/{1.73_m2} (ref >59)
GFR, EST NON AFRICAN AMERICAN: 75 mL/min/{1.73_m2} (ref >59)
GLOBULIN, TOTAL: 2.5 g/dL (ref 1.5–4.5)
Glucose: 99 mg/dL (ref 65–99)
POTASSIUM: 4.5 mmol/L (ref 3.5–5.2)
SODIUM: 142 mmol/L (ref 134–144)
TOTAL PROTEIN: 6.9 g/dL (ref 6.0–8.5)

## 2015-10-27 LAB — CBC WITH DIFFERENTIAL/PLATELET
BASOS: 1 %
Basophils Absolute: 0 10*3/uL (ref 0.0–0.2)
EOS (ABSOLUTE): 0.1 10*3/uL (ref 0.0–0.4)
EOS: 1 %
HEMATOCRIT: 42.4 % (ref 37.5–51.0)
Hemoglobin: 14.4 g/dL (ref 12.6–17.7)
IMMATURE GRANS (ABS): 0 10*3/uL (ref 0.0–0.1)
IMMATURE GRANULOCYTES: 0 %
LYMPHS: 23 %
Lymphocytes Absolute: 1.3 10*3/uL (ref 0.7–3.1)
MCH: 30.4 pg (ref 26.6–33.0)
MCHC: 34 g/dL (ref 31.5–35.7)
MCV: 90 fL (ref 79–97)
Monocytes Absolute: 0.5 10*3/uL (ref 0.1–0.9)
Monocytes: 9 %
NEUTROS PCT: 66 %
Neutrophils Absolute: 3.7 10*3/uL (ref 1.4–7.0)
Platelets: 222 10*3/uL (ref 150–379)
RBC: 4.74 x10E6/uL (ref 4.14–5.80)
RDW: 14.1 % (ref 12.3–15.4)
WBC: 5.6 10*3/uL (ref 3.4–10.8)

## 2015-10-27 LAB — TSH: TSH: 2.45 u[IU]/mL (ref 0.450–4.500)

## 2015-10-27 LAB — PSA: PROSTATE SPECIFIC AG, SERUM: 0.7 ng/mL (ref 0.0–4.0)

## 2015-11-02 ENCOUNTER — Ambulatory Visit
Admission: EM | Admit: 2015-11-02 | Discharge: 2015-11-02 | Disposition: A | Discharge status: Home / Self Care | Payer: Worker's Compensation | Attending: Family Medicine | Admitting: Family Medicine

## 2015-11-02 DIAGNOSIS — S39012D Strain of muscle, fascia and tendon of lower back, subsequent encounter: Secondary | ICD-10-CM

## 2015-11-02 MED ORDER — METAXALONE 800 MG PO TABS: 800.0000 mg | ORAL_TABLET | Freq: Three times a day (TID) | ORAL | 0 refills | Status: DC

## 2015-11-02 NOTE — ED Triage Notes (Addendum)
Patient is here for a follow up from a WC back injury that happened a few days ago. He states he is still in a lot of pain and he contacted his employee nurse and she advised him to follow up here.

## 2015-11-02 NOTE — ED Provider Notes (Signed)
[facility]    CSN: [id number] Arrival date & time: 11/02/15  1250     History   Chief Complaint Chief Complaint  Patient presents with  . Back Pain    WC Follow up    HPI Brent GrillRickey E Smith is a 60 y.o. male.   60 yo male with a c/o low back pain since fall at work here for follow up. Patient was seen last week, x-ray negative and diagnosed with lumbar/low back strain. States was slowly improving, however yesterday woke up with pain increasing again. Denies any numbness/tingling, saddle anesthesia, pain radiating. States flexeril didn't seem to help.    The history is provided by the patient.    Past Medical History:  Diagnosis Date  . Hyperlipidemia   . Hypertension     Patient Active Problem List   Diagnosis Date Noted  . Hypertension 07/01/2014  . Hyperlipidemia 07/01/2014  . Erectile dysfunction 07/01/2014  . Impaired fasting blood sugar 07/01/2014  . Metabolic syndrome 07/01/2014    Past Surgical History:  Procedure Laterality Date  . SHOULDER SURGERY  2009  . TONSILLECTOMY         Home Medications    Prior to Admission medications   Medication Sig Start Date End Date Taking? Authorizing Provider  aspirin 81 MG tablet Take 81 mg by mouth daily.   Yes Historical Provider, MD  atorvastatin (LIPITOR) 40 MG tablet TAKE 1 TABLET BY MOUTH AT BEDTIME 08/31/15  Yes Gabriel Cirriheryl Wicker, NP  benazepril (LOTENSIN) 20 MG tablet Take 1 tablet (20 mg total) by mouth daily. 08/31/15  Yes Gabriel Cirriheryl Wicker, NP  cyclobenzaprine (FLEXERIL) 10 MG tablet Take 1 tablet (10 mg total) by mouth at bedtime. 10/26/15  Yes Payton Mccallumrlando Alyza Artiaga, MD  HYDROcodone-acetaminophen (NORCO/VICODIN) 5-325 MG tablet 1-2 tabs po q 8 hours prn severe pain 10/26/15  Yes Payton Mccallumrlando Blaise Grieshaber, MD  metaxalone (SKELAXIN) 800 MG tablet Take 1 tablet (800 mg total) by mouth 3 (three) times daily. As needed 11/02/15   Payton Mccallumrlando Kel Senn, MD    Family History Family History  Problem Relation Age of Onset  .  Hypertension Mother   . Parkinson's disease Mother   . Hypertension Father   . Dementia Father   . Hypertension Sister   . Hypertension Brother   . Hypertension Maternal Grandfather     Social History Social History  Substance Use Topics  . Smoking status: Former Games developermoker  . Smokeless tobacco: Never Used  . Alcohol use Yes     Comment: pt states on occasion or on vacation     Allergies   Review of patient's allergies indicates no known allergies.   Review of Systems Review of Systems   Physical Exam Triage Vital Signs ED Triage Vitals  Enc Vitals Group     BP      Pulse      Resp      Temp      Temp src      SpO2      Weight      Height      Head Circumference      Peak Flow      Pain Score      Pain Loc      Pain Edu?      Excl. in GC?    No data found.   Updated Vital Signs BP (!) 152/101 (BP Location: Left Arm)   Pulse 80   Temp 97.8 F (36.6 C) (Oral)   Resp 18  SpO2 98%   Visual Acuity Right Eye Distance:   Left Eye Distance:   Bilateral Distance:    Right Eye Near:   Left Eye Near:    Bilateral Near:     Physical Exam  Constitutional: He appears well-developed and well-nourished. No distress.  Neck: Normal range of motion. Neck supple. No tracheal deviation present.  Pulmonary/Chest: Effort normal. No stridor. No respiratory distress.  Musculoskeletal:       Cervical back: He exhibits normal range of motion, no tenderness, no bony tenderness, no swelling, no edema, no deformity, no laceration, no pain, no spasm and normal pulse.       Lumbar back: He exhibits tenderness (to palpation over the right lumbar paraspinous muscles) and spasm. He exhibits normal range of motion, no bony tenderness, no swelling, no edema, no deformity, no laceration, no pain and normal pulse.  Neurological: He is alert. He has normal reflexes. He displays normal reflexes. He exhibits normal muscle tone. Coordination normal.  Skin: No rash noted. He is not  diaphoretic.  Nursing note and vitals reviewed.    UC Treatments / Results  Labs (all labs ordered are listed, but only abnormal results are displayed) Labs Reviewed - No data to display  EKG  EKG Interpretation None       Radiology No results found.  Procedures Procedures (including critical care time)  Medications Ordered in UC Medications - No data to display   Initial Impression / Assessment and Plan / UC Course  I have reviewed the triage vital signs and the nursing notes.  Pertinent labs & imaging results that were available during my care of the patient were reviewed by me and considered in my medical decision making (see chart for details).  Clinical Course      Final Clinical Impressions(s) / UC Diagnoses   Final diagnoses:  Strain of lumbar region, subsequent encounter    New Prescriptions Discharge Medication List as of 11/02/2015  2:03 PM    START taking these medications   Details  metaxalone (SKELAXIN) 800 MG tablet Take 1 tablet (800 mg total) by mouth 3 (three) times daily. As needed, Starting Mon 11/02/2015, Normal       1. diagnosis reviewed with patient 2. rx as per orders above; reviewed possible side effects, interactions, risks and benefits  3. Recommend supportive treatment with heat, stretching, work restrictions for one more week 4. Follow-up in 1 week   Payton Mccallumrlando Hedaya Latendresse, MD 11/02/15 1740

## 2015-11-09 ENCOUNTER — Ambulatory Visit
Admission: EM | Admit: 2015-11-09 | Discharge: 2015-11-09 | Disposition: A | Discharge status: Home / Self Care | Payer: Worker's Compensation | Attending: Family Medicine | Admitting: Family Medicine

## 2015-11-09 DIAGNOSIS — M545 Low back pain, unspecified: Secondary | ICD-10-CM

## 2015-11-09 NOTE — ED Triage Notes (Signed)
Patient here is for a follow-up visit for workers comp back injury x 2 weeks. Patient reports that he is improving but doesn't feel like he is 100%. Patient reports that he feels like he is ready to go back to work.

## 2015-11-09 NOTE — ED Provider Notes (Signed)
CSN: [id number]     Arrival date & time 11/09/15  1004 History   None    Chief Complaint  Patient presents with  . Back Pain    Follow up Workers Comp   (Consider location/radiation/quality/duration/timing/severity/associated sxs/prior Treatment) HPI  This a 60 year old male returns today after last being seen10/30/2017 after sustaining an injury at work diagnosis strain of the lumbar area. 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 He turned on 2 occasions last time switching the Flexeril for Skelaxin. The patient states that the Skelaxin has worked well he is feeling better and is ready to return to work.     Past Medical History:  Diagnosis Date  . Hyperlipidemia   . Hypertension    Past Surgical History:  Procedure Laterality Date  . SHOULDER SURGERY  2009  . TONSILLECTOMY     Family History  Problem Relation Age of Onset  . Hypertension Mother   . Parkinson's disease Mother   . Hypertension Father   . Dementia Father   . Hypertension Sister   . Hypertension Brother   . Hypertension Maternal Grandfather    Social History  Substance Use Topics  . Smoking status: Former Games developermoker  . Smokeless tobacco: Never Used  . Alcohol use Yes     Comment: pt states on occasion or on vacation    Review of Systems  Constitutional: Positive for activity change. Negative for chills, fatigue and fever.  Musculoskeletal: Positive for back pain.  All other systems reviewed and are negative.   Allergies  Patient has no known allergies.  Home Medications   Prior to Admission medications   Medication Sig Start Date End Date Taking? Authorizing Provider  aspirin 81 MG tablet Take 81 mg by mouth daily.   Yes Historical Provider, MD  atorvastatin (LIPITOR) 40 MG tablet TAKE 1 TABLET BY MOUTH AT BEDTIME 08/31/15  Yes Gabriel Cirriheryl Wicker, NP  benazepril (LOTENSIN) 20 MG tablet Take 1 tablet (20 mg total) by mouth daily. 08/31/15  Yes Gabriel Cirriheryl  Wicker, NP  cyclobenzaprine (FLEXERIL) 10 MG tablet Take 1 tablet (10 mg total) by mouth at bedtime. 10/26/15  Yes Payton Mccallumrlando Conty, MD  metaxalone (SKELAXIN) 800 MG tablet Take 1 tablet (800 mg total) by mouth 3 (three) times daily. As needed 11/02/15   Payton Mccallumrlando Conty, MD   Meds Ordered and Administered this Visit  Medications - No data to display  BP (!) 151/102 (BP Location: Left Arm)   Pulse 65   Temp 97.5 F (36.4 C) (Oral)   Resp 16   Ht 5\' 8"  (1.727 m)   Wt 197 lb (89.4 kg)   SpO2 99%   BMI 29.95 kg/m  No data found.   Physical Exam  Constitutional: He is oriented to person, place, and time. He appears well-developed and well-nourished. No distress.  HENT:  Head: Normocephalic and atraumatic.  Eyes: EOM are normal. Pupils are equal, round, and reactive to light.  Neck: Normal range of motion. Neck supple.  Musculoskeletal: Normal range of motion. He exhibits no edema, tenderness or deformity.  Neurological: He is alert and oriented to person, place, and time.  Skin: Skin is warm and dry. He is not diaphoretic.  Psychiatric: He has a normal mood and affect. His behavior is normal. Judgment and thought content normal.  Nursing note and vitals reviewed.   Urgent Care Course   Clinical Course     Procedures (including critical care time)  Labs Review Labs Reviewed - No data to display  Imaging Review No results found.   Visual Acuity Review  Right Eye Distance:   Left Eye Distance:   Bilateral Distance:    Right Eye Near:   Left Eye Near:    Bilateral Near:         MDM   1. Acute midline low back pain without sciatica    Patient's exam is normal. He is ready to return to work. [place] to [place]. Continue with his present medications. He should return to work with no restrictions.    Lutricia FeilWilliam P Roma Bierlein, PA-C 11/09/15 1439

## 2015-11-16 ENCOUNTER — Telehealth: Payer: Self-pay | Admitting: Unknown Physician Specialty

## 2015-11-16 MED ORDER — SCOPOLAMINE 1 MG/3DAYS TD PT72: 1.0000 | MEDICATED_PATCH | TRANSDERMAL | 12 refills | Status: DC

## 2015-11-16 NOTE — Telephone Encounter (Signed)
Called and spoke to patient. Patient wanted to know if Elnita MaxwellCheryl could send him in some motion sickness patches for the cruise he is going on soon. He would like them sent to [facility].

## 2015-11-16 NOTE — Telephone Encounter (Signed)
Called and let patient know that medication was sent in. ?

## 2015-12-24 ENCOUNTER — Other Ambulatory Visit: Payer: Self-pay | Admitting: Unknown Physician Specialty

## 2015-12-25 ENCOUNTER — Telehealth: Payer: Self-pay | Admitting: Unknown Physician Specialty

## 2015-12-25 MED ORDER — BENAZEPRIL HCL 20 MG PO TABS: 20.0000 mg | ORAL_TABLET | Freq: Every day | ORAL | 1 refills | Status: DC

## 2015-12-25 NOTE — Telephone Encounter (Signed)
Benazepril sent to [facility]

## 2015-12-25 NOTE — Telephone Encounter (Signed)
Routing to provider  

## 2015-12-25 NOTE — Telephone Encounter (Signed)
Pharmacy called stated pt needs refill on Benazepril. Stated pt is leaving to go out of town today. Can this medication be sent ASAP. Pharm is [facility]. Pt is there now to pick up the medication. Thanks.

## 2016-04-25 ENCOUNTER — Ambulatory Visit: Payer: BLUE CROSS/BLUE SHIELD | Admitting: Unknown Physician Specialty

## 2016-05-09 ENCOUNTER — Encounter: Payer: Self-pay | Admitting: Unknown Physician Specialty

## 2016-05-09 ENCOUNTER — Ambulatory Visit (INDEPENDENT_AMBULATORY_CARE_PROVIDER_SITE_OTHER): Payer: BLUE CROSS/BLUE SHIELD | Admitting: Unknown Physician Specialty

## 2016-05-09 VITALS — BP 112/60 | HR 74 | Temp 97.9°F | Ht 67.7 in | Wt 199.6 lb

## 2016-05-09 DIAGNOSIS — K2100 Gastro-esophageal reflux disease with esophagitis, without bleeding: Secondary | ICD-10-CM

## 2016-05-09 DIAGNOSIS — I10 Essential (primary) hypertension: Secondary | ICD-10-CM | POA: Diagnosis not present

## 2016-05-09 DIAGNOSIS — Q390 Atresia of esophagus without fistula: Secondary | ICD-10-CM | POA: Diagnosis not present

## 2016-05-09 DIAGNOSIS — T753XXD Motion sickness, subsequent encounter: Secondary | ICD-10-CM | POA: Diagnosis not present

## 2016-05-09 MED ORDER — OMEPRAZOLE 20 MG PO CPDR: 20.0000 mg | DELAYED_RELEASE_CAPSULE | Freq: Every day | ORAL | 3 refills | Status: DC

## 2016-05-09 MED ORDER — SCOPOLAMINE 1 MG/3DAYS TD PT72: 1.0000 | MEDICATED_PATCH | TRANSDERMAL | 12 refills | Status: DC

## 2016-05-09 NOTE — Progress Notes (Signed)
BP 112/60   Pulse 74   Temp 97.9 F (36.6 C)   Ht 5' 7.7" (1.72 m)   Wt 199 lb 9.6 oz (90.5 kg)   SpO2 97%   BMI 30.62 kg/m    Subjective:    Patient ID: Brent Smith, male    DOB: 24-Apr-1955, 61 y.o.   MRN: [id number]  HPI: Brent Smith is a 61 y.o. male  Chief Complaint  Patient presents with  . Hyperlipidemia  . Hypertension   Hypertension Using medications without difficulty Average home BPs "a little high and a little lower"    No problems or lightheadedness No chest pain with exertion or shortness of breath No Edema   Hyperlipidemia Using medications without problems: No Muscle aches  Diet compliance: Limited as on the road Exercise: Does a lot of walking  Dysphagia Pt states sometimes when he eats, he gets choked and sometimes has to "spit it out."  He states a long time ago, when he had tonsils out and ENT told him he may he have trouble swallowing.  He is taking an OTC acid reducer as he does states he has significant acid reflux.    Motion sick Patches helped hem on a cruise and would like another refill  Social History   Social History  . Marital status: Married    Spouse name: N/A  . Number of children: N/A  . Years of education: N/A   Occupational History  . Not on file.   Social History Main Topics  . Smoking status: Former Games developer  . Smokeless tobacco: Never Used  . Alcohol use Yes     Comment: pt states on occasion or on vacation  . Drug use: No  . Sexual activity: Yes   Other Topics Concern  . Not on file   Social History Narrative  . No narrative on file   Past Medical History:  Diagnosis Date  . Hyperlipidemia   . Hypertension     Relevant past medical, surgical, family and social history reviewed and updated as indicated. Interim medical history since our last visit reviewed. Allergies and medications reviewed and updated.  Review of Systems  Per HPI unless specifically indicated above     Objective:      BP 112/60   Pulse 74   Temp 97.9 F (36.6 C)   Ht 5' 7.7" (1.72 m)   Wt 199 lb 9.6 oz (90.5 kg)   SpO2 97%   BMI 30.62 kg/m   Wt Readings from Last 3 Encounters:  05/09/16 199 lb 9.6 oz (90.5 kg)  11/09/15 197 lb (89.4 kg)  10/26/15 197 lb (89.4 kg)    Physical Exam  Constitutional: He is oriented to person, place, and time. He appears well-developed and well-nourished. No distress.  HENT:  Head: Normocephalic and atraumatic.  Eyes: Conjunctivae and lids are normal. Right eye exhibits no discharge. Left eye exhibits no discharge. No scleral icterus.  Neck: Normal range of motion. Neck supple. No JVD present. Carotid bruit is not present.  Cardiovascular: Normal rate, regular rhythm and normal heart sounds.   Pulmonary/Chest: Effort normal and breath sounds normal. No respiratory distress.  Abdominal: Normal appearance. There is no splenomegaly or hepatomegaly.  Musculoskeletal: Normal range of motion.  Neurological: He is alert and oriented to person, place, and time.  Skin: Skin is warm, dry and intact. No rash noted. No pallor.  Psychiatric: He has a normal mood and affect. His behavior is normal. Judgment and thought  content normal.    Results for orders placed or performed in visit on 10/26/15  CBC with Differential/Platelet  Result Value Ref Range   WBC 5.6 3.4 - 10.8 x10E3/uL   RBC 4.74 4.14 - 5.80 x10E6/uL   Hemoglobin 14.4 12.6 - 17.7 g/dL   Hematocrit 16.1 09.6 - 51.0 %   MCV 90 79 - 97 fL   MCH 30.4 26.6 - 33.0 pg   MCHC 34.0 31.5 - 35.7 g/dL   RDW 04.5 40.9 - 81.1 %   Platelets 222 150 - 379 x10E3/uL   Neutrophils 66 Not Estab. %   Lymphs 23 Not Estab. %   Monocytes 9 Not Estab. %   Eos 1 Not Estab. %   Basos 1 Not Estab. %   Neutrophils Absolute 3.7 1.4 - 7.0 x10E3/uL   Lymphocytes Absolute 1.3 0.7 - 3.1 x10E3/uL   Monocytes Absolute 0.5 0.1 - 0.9 x10E3/uL   EOS (ABSOLUTE) 0.1 0.0 - 0.4 x10E3/uL   Basophils Absolute 0.0 0.0 - 0.2 x10E3/uL   Immature  Granulocytes 0 Not Estab. %   Immature Grans (Abs) 0.0 0.0 - 0.1 x10E3/uL  Comprehensive metabolic panel  Result Value Ref Range   Glucose 99 65 - 99 mg/dL   BUN 13 8 - 27 mg/dL   Creatinine, Ser 9.14 0.76 - 1.27 mg/dL   GFR calc non Af Amer 75 >59 mL/min/1.73   GFR calc Af Amer 86 >59 mL/min/1.73   BUN/Creatinine Ratio 12 10 - 24   Sodium 142 134 - 144 mmol/L   Potassium 4.5 3.5 - 5.2 mmol/L   Chloride 104 96 - 106 mmol/L   CO2 24 18 - 29 mmol/L   Calcium 9.2 8.6 - 10.2 mg/dL   Total Protein 6.9 6.0 - 8.5 g/dL   Albumin 4.4 3.6 - 4.8 g/dL   Globulin, Total 2.5 1.5 - 4.5 g/dL   Albumin/Globulin Ratio 1.8 1.2 - 2.2   Bilirubin Total 0.6 0.0 - 1.2 mg/dL   Alkaline Phosphatase 55 39 - 117 IU/L   AST 12 0 - 40 IU/L   ALT 15 0 - 44 IU/L  Lipid Panel w/o Chol/HDL Ratio  Result Value Ref Range   Cholesterol, Total 158 100 - 199 mg/dL   Triglycerides 782 0 - 149 mg/dL   HDL 47 >95 mg/dL   VLDL Cholesterol Cal 22 5 - 40 mg/dL   LDL Calculated 89 0 - 99 mg/dL  TSH  Result Value Ref Range   TSH 2.450 0.450 - 4.500 uIU/mL  PSA  Result Value Ref Range   Prostate Specific Ag, Serum 0.7 0.0 - 4.0 ng/mL      Assessment & Plan:   Problem List Items Addressed This Visit      Unprioritized   Hypertension    Stable.  BP 112/60 on recheck.  Stable.  Continue current medication       Other Visit Diagnoses    Gastroesophageal reflux disease with esophagitis    -  Primary   Relevant Orders   Ambulatory referral to Gastroenterology   Esophageal atresia       New problem. Will refer to GI.  Pt ed about chewing food   Relevant Orders   Ambulatory referral to Gastroenterology   Motion sickness, subsequent encounter       Refill patches for an upcoming trip   Relevant Medications   scopolamine (TRANSDERM-SCOP) 1 MG/3DAYS       Follow up plan: Return in about 6 months (around 11/09/2016) for  physical.

## 2016-05-09 NOTE — Assessment & Plan Note (Addendum)
Stable.  BP 112/60 on recheck.  Stable.  Continue current medication

## 2016-05-11 ENCOUNTER — Telehealth: Payer: Self-pay

## 2016-05-11 MED ORDER — PANTOPRAZOLE SODIUM 40 MG PO TBEC: 40.0000 mg | DELAYED_RELEASE_TABLET | Freq: Every day | ORAL | 3 refills | Status: DC

## 2016-05-11 NOTE — Telephone Encounter (Signed)
Initiated PA as asked on fax with [facility]. The pharmacy tech stated that omeprazole could not be approved by insurance because it can be bought OTC. I asked which medications could be covered in the same class as omeprazole and the pharmacy tech stated that pantoprazole or dexilant would be covered. Can we send in one of these options for the patient?

## 2016-06-24 ENCOUNTER — Telehealth: Payer: Self-pay | Admitting: Unknown Physician Specialty

## 2016-06-24 MED ORDER — BENAZEPRIL HCL 20 MG PO TABS
20.0000 mg | ORAL_TABLET | Freq: Every day | ORAL | 0 refills | Status: DC
Start: 2016-06-24 — End: 2017-03-03

## 2016-06-24 NOTE — Telephone Encounter (Signed)
Pt called and [place] and [place] BP medication. He would like to know if he could have a short supply sent to the local [facility]. He only needs 5 pill to last until he will be home. Phone number for pharmacy is [phone].

## 2016-06-24 NOTE — Telephone Encounter (Signed)
Called and left patient a VM letting him know that his prescription has been sent in as requested.  

## 2016-06-24 NOTE — Telephone Encounter (Signed)
Routing to provider. Patient would like a short supply on his Benazepril sent in to a pharmacy near him as he is out of town. Pharmacy looked up by phone number provided and put in chart.

## 2016-07-22 ENCOUNTER — Ambulatory Visit: Admission: EM | Admit: 2016-07-22 | Discharge: 2016-07-22 | Discharge status: Absent without leave | Payer: Self-pay

## 2016-07-22 ENCOUNTER — Telehealth: Payer: Self-pay | Admitting: Unknown Physician Specialty

## 2016-07-22 NOTE — Telephone Encounter (Signed)
Letter generated, signed by Elnita Maxwellheryl and placed up front. Patient notified that it was ready to be picked up. Copy also placed in scan box.

## 2016-07-22 NOTE — Telephone Encounter (Signed)
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     Patient's DOT physical is on 07/25/2016 at another facility.    Please Advise.  Thank you

## 2016-07-22 NOTE — Telephone Encounter (Signed)
Routing to provider. Elnita MaxwellCheryl, is it OK to type a letter?

## 2016-07-22 NOTE — Telephone Encounter (Signed)
It is fine.  I'm just not sure why such letters are necessary

## 2016-07-25 ENCOUNTER — Ambulatory Visit
Admission: EM | Admit: 2016-07-25 | Discharge: 2016-07-25 | Disposition: A | Discharge status: Home / Self Care | Payer: Self-pay | Attending: Family Medicine | Admitting: Family Medicine

## 2016-07-25 DIAGNOSIS — Z0289 Encounter for other administrative examinations: Secondary | ICD-10-CM

## 2016-07-25 DIAGNOSIS — Z024 Encounter for examination for driving license: Secondary | ICD-10-CM

## 2016-07-25 HISTORY — DX: Gastro-esophageal reflux disease without esophagitis: K21.9

## 2016-07-25 LAB — DEPT OF TRANSP DIPSTICK, URINE (ARMC ONLY)
Glucose, UA: NEGATIVE mg/dL
HGB URINE DIPSTICK: NEGATIVE
PROTEIN: NEGATIVE mg/dL
Specific Gravity, Urine: 1.01 (ref 1.005–1.030)

## 2016-07-25 NOTE — ED Provider Notes (Signed)
[facility]    CSN: [id number] Arrival date & time: 07/25/16  0805     History   Chief Complaint Chief Complaint  Patient presents with  . Commercial Driver's License Exam    HPI REAGEN HABERMAN is a 61 y.o. male.   Patient here for DOT Physical (see scanned form)   The history is provided by the patient.    Past Medical History:  Diagnosis Date  . GERD (gastroesophageal reflux disease)   . Hyperlipidemia   . Hypertension     Patient Active Problem List   Diagnosis Date Noted  . Hypertension 07/01/2014  . Hyperlipidemia 07/01/2014  . Erectile dysfunction 07/01/2014  . Impaired fasting blood sugar 07/01/2014  . Metabolic syndrome 07/01/2014    Past Surgical History:  Procedure Laterality Date  . SHOULDER SURGERY  2009  . TONSILLECTOMY         Home Medications    Prior to Admission medications   Medication Sig Start Date End Date Taking? Authorizing Provider  aspirin 81 MG tablet Take 81 mg by mouth daily.    [provider]  atorvastatin (LIPITOR) 40 MG tablet TAKE 1 TABLET BY MOUTH AT BEDTIME 08/31/15   Gabriel Cirri, NP  benazepril (LOTENSIN) 20 MG tablet Take 1 tablet (20 mg total) by mouth daily. 12/25/15   Particia Nearing, PA-C  benazepril (LOTENSIN) 20 MG tablet Take 1 tablet (20 mg total) by mouth daily. 06/24/16   Gabriel Cirri, NP  omeprazole (PRILOSEC) 20 MG capsule Take 1 capsule (20 mg total) by mouth daily. 05/09/16   Gabriel Cirri, NP  pantoprazole (PROTONIX) 40 MG tablet Take 1 tablet (40 mg total) by mouth daily. 05/11/16   Gabriel Cirri, NP  scopolamine (TRANSDERM-SCOP) 1 MG/3DAYS Place 1 patch (1.5 mg total) onto the skin every 3 (three) days. 05/09/16   Gabriel Cirri, NP    Family History Family History  Problem Relation Age of Onset  . Hypertension Mother   . Parkinson's disease Mother   . Hypertension Father   . Dementia Father   . Hypertension Sister   . Hypertension Brother   . Hypertension  Maternal Grandfather     Social History Social History  Substance Use Topics  . Smoking status: Former Games developer  . Smokeless tobacco: Never Used  . Alcohol use Yes     Comment: pt states on occasion or on vacation     Allergies   Patient has no known allergies.   Review of Systems Review of Systems   Physical Exam Triage Vital Signs ED Triage Vitals  Enc Vitals Group     BP 07/25/16 0815 (!) 150/85     Pulse Rate 07/25/16 0815 62     Resp 07/25/16 0815 16     Temp 07/25/16 0815 (!) 97.5 F (36.4 C)     Temp Source 07/25/16 0815 Oral     SpO2 07/25/16 0815 97 %     Weight 07/25/16 0812 201 lb (91.2 kg)     Height 07/25/16 0812 5\' 8"  (1.727 m)     Head Circumference --      Peak Flow --      Pain Score --      Pain Loc --      Pain Edu? --      Excl. in GC? --    No data found.   Updated Vital Signs BP (!) 150/85 (BP Location: Right Arm)   Pulse 62   Temp (!) 97.5 F (36.4  C) (Oral)   Resp 16   Ht 5\' 8"  (1.727 m)   Wt 201 lb (91.2 kg)   SpO2 97%   BMI 30.56 kg/m   Visual Acuity Right Eye Distance:   Left Eye Distance:   Bilateral Distance:    Right Eye Near:   Left Eye Near:    Bilateral Near:     Physical Exam   UC Treatments / Results  Labs (all labs ordered are listed, but only abnormal results are displayed) Labs Reviewed  DEPT OF TRANSP DIPSTICK, URINE([facility] ONLY)    EKG  EKG Interpretation None       Radiology No results found.  Procedures Procedures (including critical care time)  Medications Ordered in UC Medications - No data to display   Initial Impression / Assessment and Plan / UC Course  I have reviewed the triage vital signs and the nursing notes.  Pertinent labs & imaging results that were available during my care of the patient were reviewed by me and considered in my medical decision making (see chart for details).      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

## 2016-07-25 NOTE — ED Triage Notes (Signed)
DOT physcial 

## 2016-08-02 ENCOUNTER — Other Ambulatory Visit: Payer: Self-pay | Admitting: Unknown Physician Specialty

## 2016-08-25 LAB — HM COLONOSCOPY

## 2016-11-16 ENCOUNTER — Other Ambulatory Visit: Payer: Self-pay | Admitting: Family Medicine

## 2016-11-18 ENCOUNTER — Encounter: Payer: BLUE CROSS/BLUE SHIELD | Admitting: Unknown Physician Specialty

## 2016-12-19 ENCOUNTER — Encounter: Payer: BLUE CROSS/BLUE SHIELD | Admitting: Unknown Physician Specialty

## 2017-01-02 ENCOUNTER — Encounter: Payer: BLUE CROSS/BLUE SHIELD | Admitting: Unknown Physician Specialty

## 2017-03-03 ENCOUNTER — Other Ambulatory Visit: Payer: Self-pay

## 2017-03-03 ENCOUNTER — Encounter: Payer: Self-pay | Admitting: Unknown Physician Specialty

## 2017-03-03 ENCOUNTER — Ambulatory Visit (INDEPENDENT_AMBULATORY_CARE_PROVIDER_SITE_OTHER): Payer: BLUE CROSS/BLUE SHIELD | Admitting: Unknown Physician Specialty

## 2017-03-03 VITALS — BP 175/100 | HR 75 | Temp 97.6°F | Ht 66.8 in | Wt 203.2 lb

## 2017-03-03 DIAGNOSIS — E7800 Pure hypercholesterolemia, unspecified: Secondary | ICD-10-CM | POA: Diagnosis not present

## 2017-03-03 DIAGNOSIS — K219 Gastro-esophageal reflux disease without esophagitis: Secondary | ICD-10-CM | POA: Diagnosis not present

## 2017-03-03 DIAGNOSIS — T753XXD Motion sickness, subsequent encounter: Secondary | ICD-10-CM

## 2017-03-03 DIAGNOSIS — I10 Essential (primary) hypertension: Secondary | ICD-10-CM | POA: Diagnosis not present

## 2017-03-03 DIAGNOSIS — Z0000 Encounter for general adult medical examination without abnormal findings: Secondary | ICD-10-CM

## 2017-03-03 DIAGNOSIS — Z0001 Encounter for general adult medical examination with abnormal findings: Secondary | ICD-10-CM | POA: Diagnosis not present

## 2017-03-03 MED ORDER — SCOPOLAMINE 1 MG/3DAYS TD PT72: 1.0000 | MEDICATED_PATCH | TRANSDERMAL | 12 refills | Status: DC

## 2017-03-03 MED ORDER — OMEPRAZOLE 20 MG PO CPDR: 20.0000 mg | DELAYED_RELEASE_CAPSULE | Freq: Every day | ORAL | 3 refills | Status: DC

## 2017-03-03 MED ORDER — BENAZEPRIL HCL 40 MG PO TABS
40.0000 mg | ORAL_TABLET | Freq: Every day | ORAL | 3 refills | Status: DC
Start: 2017-03-03 — End: 2018-03-12

## 2017-03-03 MED ORDER — ATORVASTATIN CALCIUM 40 MG PO TABS: ORAL_TABLET | ORAL | 1 refills | Status: DC

## 2017-03-03 NOTE — Assessment & Plan Note (Signed)
Stable, continue present medications.   

## 2017-03-03 NOTE — Assessment & Plan Note (Signed)
Not to goal.  Discussed options for management.  Elected to increase Benazepril for 20 mg to 40 mg.

## 2017-03-03 NOTE — Assessment & Plan Note (Signed)
Check Lipid panel today 

## 2017-03-03 NOTE — Progress Notes (Signed)
BP (!) 175/100   Pulse 75   Temp 97.6 F (36.4 C) (Oral)   Ht 5' 6.8" (1.697 m)   Wt 203 lb 3.2 oz (92.2 kg)   SpO2 96%   BMI 32.02 kg/m    Subjective:    Patient ID: Brent Smith, male    DOB: Mar 26, 1955, 62 y.o.   MRN: [id number]  HPI: Brent Smith is a 62 y.o. male  Chief Complaint  Patient presents with  . Annual Exam   Hypertension Using medications without difficulty.   Average home BPs Varies.    No problems or lightheadedness No chest pain with exertion or shortness of breath No Edema  Hyperlipidemia Using medications without problems: No Muscle aches  Diet compliance:Exercise: Exercise is not as good.  Doing a lot of care giving with in-laws    GERD Doing with with Omeprazole. No heartburn or reflux symptoms.   Had a bad episode only once  Relevant past medical, surgical, family and social history reviewed and updated as indicated. Interim medical history since our last visit reviewed. Allergies and medications reviewed and updated.  Review of Systems  Constitutional: Negative.   HENT: Negative.   Eyes: Negative.   Respiratory: Negative.   Cardiovascular: Negative.   Gastrointestinal: Negative.   Endocrine: Negative.   Genitourinary: Negative.   Skin: Negative.   Allergic/Immunologic: Negative.   Neurological: Negative.   Hematological: Negative.   Psychiatric/Behavioral: Negative.     Per HPI unless specifically indicated above     Objective:    BP (!) 175/100   Pulse 75   Temp 97.6 F (36.4 C) (Oral)   Ht 5' 6.8" (1.697 m)   Wt 203 lb 3.2 oz (92.2 kg)   SpO2 96%   BMI 32.02 kg/m   Wt Readings from Last 3 Encounters:  03/03/17 203 lb 3.2 oz (92.2 kg)  07/25/16 201 lb (91.2 kg)  05/09/16 199 lb 9.6 oz (90.5 kg)    Physical Exam  Constitutional: He is oriented to person, place, and time. He appears well-developed and well-nourished. No distress.  HENT:  Head: Normocephalic and atraumatic.  Right Ear: Tympanic membrane,  external ear and ear canal normal.  Left Ear: Tympanic membrane, external ear and ear canal normal.  Mouth/Throat: Uvula is midline, oropharynx is clear and moist and mucous membranes are normal.  Eyes: Conjunctivae and lids are normal. Pupils are equal, round, and reactive to light. Right eye exhibits no discharge. Left eye exhibits no discharge. No scleral icterus.  Neck: Normal range of motion. Neck supple. No JVD present. Carotid bruit is not present.  Cardiovascular: Normal rate, regular rhythm and normal heart sounds. Exam reveals no gallop and no friction rub.  No murmur heard. Pulmonary/Chest: Effort normal and breath sounds normal. No respiratory distress.  Abdominal: Soft. Normal appearance and bowel sounds are normal. He exhibits no distension. There is no splenomegaly or hepatomegaly. There is no tenderness.  Musculoskeletal: Normal range of motion.  Neurological: He is alert and oriented to person, place, and time. He has normal reflexes.  Skin: Skin is warm, dry and intact. No rash noted. No pallor.  Psychiatric: He has a normal mood and affect. His behavior is normal. Judgment and thought content normal.       Assessment & Plan:   Problem List Items Addressed This Visit      Unprioritized   GERD (gastroesophageal reflux disease)    Stable, continue present medications.        Relevant Medications  omeprazole (PRILOSEC) 20 MG capsule   scopolamine (TRANSDERM-SCOP) 1 MG/3DAYS   Hyperlipidemia    Check Lipid panel today      Relevant Medications   atorvastatin (LIPITOR) 40 MG tablet   benazepril (LOTENSIN) 40 MG tablet   Other Relevant Orders   Lipid Panel w/o Chol/HDL Ratio   Hypertension    Not to goal.  Discussed options for management.  Elected to increase Benazepril for 20 mg to 40 mg.        Relevant Medications   atorvastatin (LIPITOR) 40 MG tablet   benazepril (LOTENSIN) 40 MG tablet   Other Relevant Orders   CBC with Differential/Platelet    Comprehensive metabolic panel    Other Visit Diagnoses    Annual physical exam    -  Primary   Relevant Orders   TSH   PSA   Motion sickness, subsequent encounter       Refill patches for an upcoming trip   Relevant Medications   scopolamine (TRANSDERM-SCOP) 1 MG/3DAYS       Follow up plan: Return in about 4 weeks (around 03/31/2017).

## 2017-03-04 LAB — LIPID PANEL W/O CHOL/HDL RATIO
CHOLESTEROL TOTAL: 162 mg/dL (ref 100–199)
HDL: 45 mg/dL (ref >39)
LDL CALC: 94 mg/dL (ref 0–99)
Triglycerides: 115 mg/dL (ref 0–149)
VLDL Cholesterol Cal: 23 mg/dL (ref 5–40)

## 2017-03-04 LAB — COMPREHENSIVE METABOLIC PANEL
A/G RATIO: 1.7 (ref 1.2–2.2)
ALBUMIN: 4.5 g/dL (ref 3.6–4.8)
ALK PHOS: 51 IU/L (ref 39–117)
ALT: 17 IU/L (ref 0–44)
AST: 15 IU/L (ref 0–40)
BILIRUBIN TOTAL: 0.3 mg/dL (ref 0.0–1.2)
BUN/Creatinine Ratio: 14 (ref 10–24)
BUN: 19 mg/dL (ref 8–27)
CHLORIDE: 107 mmol/L — AB (ref 96–106)
CO2: 24 mmol/L (ref 20–29)
Calcium: 9.5 mg/dL (ref 8.6–10.2)
Creatinine, Ser: 1.34 mg/dL — ABNORMAL HIGH (ref 0.76–1.27)
GFR calc non Af Amer: 56 mL/min/{1.73_m2} — ABNORMAL LOW (ref >59)
GFR, EST AFRICAN AMERICAN: 65 mL/min/{1.73_m2} (ref >59)
GLOBULIN, TOTAL: 2.7 g/dL (ref 1.5–4.5)
Glucose: 101 mg/dL — ABNORMAL HIGH (ref 65–99)
Potassium: 4.5 mmol/L (ref 3.5–5.2)
SODIUM: 145 mmol/L — AB (ref 134–144)
TOTAL PROTEIN: 7.2 g/dL (ref 6.0–8.5)

## 2017-03-04 LAB — CBC WITH DIFFERENTIAL/PLATELET
BASOS: 1 %
Basophils Absolute: 0 10*3/uL (ref 0.0–0.2)
EOS (ABSOLUTE): 0.2 10*3/uL (ref 0.0–0.4)
EOS: 3 %
HEMATOCRIT: 42.8 % (ref 37.5–51.0)
Hemoglobin: 14.4 g/dL (ref 13.0–17.7)
IMMATURE GRANS (ABS): 0 10*3/uL (ref 0.0–0.1)
Immature Granulocytes: 0 %
LYMPHS ABS: 1.6 10*3/uL (ref 0.7–3.1)
Lymphs: 28 %
MCH: 30.2 pg (ref 26.6–33.0)
MCHC: 33.6 g/dL (ref 31.5–35.7)
MCV: 90 fL (ref 79–97)
MONOCYTES: 10 %
Monocytes Absolute: 0.5 10*3/uL (ref 0.1–0.9)
NEUTROS ABS: 3.2 10*3/uL (ref 1.4–7.0)
Neutrophils: 58 %
Platelets: 224 10*3/uL (ref 150–379)
RBC: 4.77 x10E6/uL (ref 4.14–5.80)
RDW: 14.1 % (ref 12.3–15.4)
WBC: 5.5 10*3/uL (ref 3.4–10.8)

## 2017-03-04 LAB — PSA: PROSTATE SPECIFIC AG, SERUM: 1.1 ng/mL (ref 0.0–4.0)

## 2017-03-04 LAB — TSH: TSH: 4.27 u[IU]/mL (ref 0.450–4.500)

## 2017-04-07 ENCOUNTER — Ambulatory Visit (INDEPENDENT_AMBULATORY_CARE_PROVIDER_SITE_OTHER): Payer: BLUE CROSS/BLUE SHIELD | Admitting: Unknown Physician Specialty

## 2017-04-07 ENCOUNTER — Encounter: Payer: Self-pay | Admitting: Unknown Physician Specialty

## 2017-04-07 DIAGNOSIS — N183 Chronic kidney disease, stage 3 unspecified: Secondary | ICD-10-CM | POA: Insufficient documentation

## 2017-04-07 DIAGNOSIS — I10 Essential (primary) hypertension: Secondary | ICD-10-CM

## 2017-04-07 NOTE — Assessment & Plan Note (Signed)
GFR was 56.  Will recheck after drinking plenty of fluids.  Will put in a future order

## 2017-04-07 NOTE — Assessment & Plan Note (Signed)
Improved on recheck at 130/80.  Improved but not to goal.  Pt will work on Delphi[employer]ASH diet.

## 2017-04-07 NOTE — Progress Notes (Signed)
BP 138/80   Pulse 69   Temp 98.8 F (37.1 C) (Oral)   Ht 5' 6.8" (1.697 m)   Wt 202 lb 9.6 oz (91.9 kg)   SpO2 98%   BMI 31.92 kg/m    Subjective:    Patient ID: Brent Smith, male    DOB: 12/15/1955, 62 y.o.   MRN: [id number]  HPI: TRENELL CONCANNON is a 62 y.o. male  Chief Complaint  Patient presents with  . Hypertension    4 week f/up   Hypertension Last visit increased Benazepril.  Using medications without difficulty.  Average home BPs   No problems or lightheadedness No chest pain with exertion or shortness of breath No Edema  Relevant past medical, surgical, family and social history reviewed and updated as indicated. Interim medical history since our last visit reviewed. Allergies and medications reviewed and updated.  Review of Systems  Constitutional: Negative.   HENT: Negative.   Eyes: Negative.   Respiratory: Negative.   Cardiovascular: Negative.   Gastrointestinal: Negative.   Endocrine: Negative.   Genitourinary: Negative.   Skin: Negative.   Allergic/Immunologic: Negative.   Neurological: Negative.   Hematological: Negative.   Psychiatric/Behavioral: Negative.     Per HPI unless specifically indicated above     Objective:    BP 138/80   Pulse 69   Temp 98.8 F (37.1 C) (Oral)   Ht 5' 6.8" (1.697 m)   Wt 202 lb 9.6 oz (91.9 kg)   SpO2 98%   BMI 31.92 kg/m   Wt Readings from Last 3 Encounters:  04/07/17 202 lb 9.6 oz (91.9 kg)  03/03/17 203 lb 3.2 oz (92.2 kg)  07/25/16 201 lb (91.2 kg)    Physical Exam  Constitutional: He is oriented to person, place, and time. He appears well-developed and well-nourished. No distress.  HENT:  Head: Normocephalic and atraumatic.  Eyes: Conjunctivae and lids are normal. Right eye exhibits no discharge. Left eye exhibits no discharge. No scleral icterus.  Neck: Normal range of motion. Neck supple. No JVD present. Carotid bruit is not present.  Cardiovascular: Normal rate, regular rhythm and  normal heart sounds.  Pulmonary/Chest: Effort normal and breath sounds normal. No respiratory distress.  Abdominal: Normal appearance. There is no splenomegaly or hepatomegaly.  Musculoskeletal: Normal range of motion.  Neurological: He is alert and oriented to person, place, and time.  Skin: Skin is warm, dry and intact. No rash noted. No pallor.  Psychiatric: He has a normal mood and affect. His behavior is normal. Judgment and thought content normal.    Results for orders placed or performed in visit on 03/03/17  CBC with Differential/Platelet  Result Value Ref Range   WBC 5.5 3.4 - 10.8 x10E3/uL   RBC 4.77 4.14 - 5.80 x10E6/uL   Hemoglobin 14.4 13.0 - 17.7 g/dL   Hematocrit 46.9 62.9 - 51.0 %   MCV 90 79 - 97 fL   MCH 30.2 26.6 - 33.0 pg   MCHC 33.6 31.5 - 35.7 g/dL   RDW 52.8 41.3 - 24.4 %   Platelets 224 150 - 379 x10E3/uL   Neutrophils 58 Not Estab. %   Lymphs 28 Not Estab. %   Monocytes 10 Not Estab. %   Eos 3 Not Estab. %   Basos 1 Not Estab. %   Neutrophils Absolute 3.2 1.4 - 7.0 x10E3/uL   Lymphocytes Absolute 1.6 0.7 - 3.1 x10E3/uL   Monocytes Absolute 0.5 0.1 - 0.9 x10E3/uL   EOS (ABSOLUTE) 0.2  0.0 - 0.4 x10E3/uL   Basophils Absolute 0.0 0.0 - 0.2 x10E3/uL   Immature Granulocytes 0 Not Estab. %   Immature Grans (Abs) 0.0 0.0 - 0.1 x10E3/uL  Comprehensive metabolic panel  Result Value Ref Range   Glucose 101 (H) 65 - 99 mg/dL   BUN 19 8 - 27 mg/dL   Creatinine, Ser 1.611.34 (H) 0.76 - 1.27 mg/dL   GFR calc non Af Amer 56 (L) >59 mL/min/1.73   GFR calc Af Amer 65 >59 mL/min/1.73   BUN/Creatinine Ratio 14 10 - 24   Sodium 145 (H) 134 - 144 mmol/L   Potassium 4.5 3.5 - 5.2 mmol/L   Chloride 107 (H) 96 - 106 mmol/L   CO2 24 20 - 29 mmol/L   Calcium 9.5 8.6 - 10.2 mg/dL   Total Protein 7.2 6.0 - 8.5 g/dL   Albumin 4.5 3.6 - 4.8 g/dL   Globulin, Total 2.7 1.5 - 4.5 g/dL   Albumin/Globulin Ratio 1.7 1.2 - 2.2   Bilirubin Total 0.3 0.0 - 1.2 mg/dL   Alkaline  Phosphatase 51 39 - 117 IU/L   AST 15 0 - 40 IU/L   ALT 17 0 - 44 IU/L  Lipid Panel w/o Chol/HDL Ratio  Result Value Ref Range   Cholesterol, Total 162 100 - 199 mg/dL   Triglycerides 096115 0 - 149 mg/dL   HDL 45 >04>39 mg/dL   VLDL Cholesterol Cal 23 5 - 40 mg/dL   LDL Calculated 94 0 - 99 mg/dL  TSH  Result Value Ref Range   TSH 4.270 0.450 - 4.500 uIU/mL  PSA  Result Value Ref Range   Prostate Specific Ag, Serum 1.1 0.0 - 4.0 ng/mL      Assessment & Plan:   Problem List Items Addressed This Visit      Unprioritized   Chronic kidney disease, stage 3 (HCC)    GFR was 56.  Will recheck after drinking plenty of fluids.  Will put in a future order      Relevant Orders   Comprehensive metabolic panel   Hypertension    Improved on recheck at 130/80.  Improved but not to goal.  Pt will work on [employer] diet.        Relevant Orders   Comprehensive metabolic panel       Follow up plan: Return in about 5 months (around 09/07/2017).

## 2017-04-07 NOTE — Patient Instructions (Signed)
DASH Eating Plan DASH stands for "Dietary Approaches to Stop Hypertension." The DASH eating plan is a healthy eating plan that has been shown to reduce high blood pressure (hypertension). It may also reduce your risk for type 2 diabetes, heart disease, and stroke. The DASH eating plan may also help with weight loss. What are tips for following this plan? General guidelines  Avoid eating more than 2,300 mg (milligrams) of salt (sodium) a day. If you have hypertension, you may need to reduce your sodium intake to 1,500 mg a day.  Limit alcohol intake to no more than 1 drink a day for nonpregnant women and 2 drinks a day for men. One drink equals 12 oz of beer, 5 oz of wine, or 1 oz of hard liquor.  Work with your health care provider to maintain a healthy body weight or to lose weight. Ask what an ideal weight is for you.  Get at least 30 minutes of exercise that causes your heart to beat faster (aerobic exercise) most days of the week. Activities may include walking, swimming, or biking.  Work with your health care provider or diet and nutrition specialist (dietitian) to adjust your eating plan to your individual calorie needs. Reading food labels  Check food labels for the amount of sodium per serving. Choose foods with less than 5 percent of the Daily Value of sodium. Generally, foods with less than 300 mg of sodium per serving fit into this eating plan.  To find whole grains, look for the word "whole" as the first word in the ingredient list. Shopping  Buy products labeled as "low-sodium" or "no salt added."  Buy fresh foods. Avoid canned foods and premade or frozen meals. Cooking  Avoid adding salt when cooking. Use salt-free seasonings or herbs instead of table salt or sea salt. Check with your health care provider or pharmacist before using salt substitutes.  Do not fry foods. Cook foods using healthy methods such as baking, boiling, grilling, and broiling instead.  Cook with  heart-healthy oils, such as olive, canola, soybean, or sunflower oil. Meal planning   Eat a balanced diet that includes: ? 5 or more servings of fruits and vegetables each day. At each meal, try to fill half of your plate with fruits and vegetables. ? Up to 6-8 servings of whole grains each day. ? Less than 6 oz of lean meat, poultry, or fish each day. A 3-oz serving of meat is about the same size as a deck of cards. One egg equals 1 oz. ? 2 servings of low-fat dairy each day. ? A serving of nuts, seeds, or beans 5 times each week. ? Heart-healthy fats. Healthy fats called Omega-3 fatty acids are found in foods such as flaxseeds and coldwater fish, like sardines, salmon, and mackerel.  Limit how much you eat of the following: ? Canned or prepackaged foods. ? Food that is high in trans fat, such as fried foods. ? Food that is high in saturated fat, such as fatty meat. ? Sweets, desserts, sugary drinks, and other foods with added sugar. ? Full-fat dairy products.  Do not salt foods before eating.  Try to eat at least 2 vegetarian meals each week.  Eat more home-cooked food and less restaurant, buffet, and fast food.  When eating at a restaurant, ask that your food be prepared with less salt or no salt, if possible. What foods are recommended? The items listed may not be a complete list. Talk with your dietitian about what   dietary choices are best for you. Grains Whole-grain or whole-wheat bread. Whole-grain or whole-wheat pasta. Brown rice. Oatmeal. Quinoa. Bulgur. Whole-grain and low-sodium cereals. Pita bread. Low-fat, low-sodium crackers. Whole-wheat flour tortillas. Vegetables Fresh or frozen vegetables (raw, steamed, roasted, or grilled). Low-sodium or reduced-sodium tomato and vegetable juice. Low-sodium or reduced-sodium tomato sauce and tomato paste. Low-sodium or reduced-sodium canned vegetables. Fruits All fresh, dried, or frozen fruit. Canned fruit in natural juice (without  added sugar). Meat and other protein foods Skinless chicken or turkey. Ground chicken or turkey. Pork with fat trimmed off. Fish and seafood. Egg whites. Dried beans, peas, or lentils. Unsalted nuts, nut butters, and seeds. Unsalted canned beans. Lean cuts of beef with fat trimmed off. Low-sodium, lean deli meat. Dairy Low-fat (1%) or fat-free (skim) milk. Fat-free, low-fat, or reduced-fat cheeses. Nonfat, low-sodium ricotta or cottage cheese. Low-fat or nonfat yogurt. Low-fat, low-sodium cheese. Fats and oils Soft margarine without trans fats. Vegetable oil. Low-fat, reduced-fat, or light mayonnaise and salad dressings (reduced-sodium). Canola, safflower, olive, soybean, and sunflower oils. Avocado. Seasoning and other foods Herbs. Spices. Seasoning mixes without salt. Unsalted popcorn and pretzels. Fat-free sweets. What foods are not recommended? The items listed may not be a complete list. Talk with your dietitian about what dietary choices are best for you. Grains Baked goods made with fat, such as croissants, muffins, or some breads. Dry pasta or rice meal packs. Vegetables Creamed or fried vegetables. Vegetables in a cheese sauce. Regular canned vegetables (not low-sodium or reduced-sodium). Regular canned tomato sauce and paste (not low-sodium or reduced-sodium). Regular tomato and vegetable juice (not low-sodium or reduced-sodium). Pickles. Olives. Fruits Canned fruit in a light or heavy syrup. Fried fruit. Fruit in cream or butter sauce. Meat and other protein foods Fatty cuts of meat. Ribs. Fried meat. Bacon. Sausage. Bologna and other processed lunch meats. Salami. Fatback. Hotdogs. Bratwurst. Salted nuts and seeds. Canned beans with added salt. Canned or smoked fish. Whole eggs or egg yolks. Chicken or turkey with skin. Dairy Whole or 2% milk, cream, and half-and-half. Whole or full-fat cream cheese. Whole-fat or sweetened yogurt. Full-fat cheese. Nondairy creamers. Whipped toppings.  Processed cheese and cheese spreads. Fats and oils Butter. Stick margarine. Lard. Shortening. Ghee. Bacon fat. Tropical oils, such as coconut, palm kernel, or palm oil. Seasoning and other foods Salted popcorn and pretzels. Onion salt, garlic salt, seasoned salt, table salt, and sea salt. Worcestershire sauce. Tartar sauce. Barbecue sauce. Teriyaki sauce. Soy sauce, including reduced-sodium. Steak sauce. Canned and packaged gravies. Fish sauce. Oyster sauce. Cocktail sauce. Horseradish that you find on the shelf. Ketchup. Mustard. Meat flavorings and tenderizers. Bouillon cubes. Hot sauce and Tabasco sauce. Premade or packaged marinades. Premade or packaged taco seasonings. Relishes. Regular salad dressings. Where to find more information:  National Heart, Lung, and Blood Institute: www.nhlbi.nih.gov  American Heart Association: www.heart.org Summary  The DASH eating plan is a healthy eating plan that has been shown to reduce high blood pressure (hypertension). It may also reduce your risk for type 2 diabetes, heart disease, and stroke.  With the DASH eating plan, you should limit salt (sodium) intake to 2,300 mg a day. If you have hypertension, you may need to reduce your sodium intake to 1,500 mg a day.  When on the DASH eating plan, aim to eat more fresh fruits and vegetables, whole grains, lean proteins, low-fat dairy, and heart-healthy fats.  Work with your health care provider or diet and nutrition specialist (dietitian) to adjust your eating plan to your individual   calorie needs. This information is not intended to replace advice given to you by your health care provider. Make sure you discuss any questions you have with your health care provider. Document Released: 12/09/2010 Document Revised: 12/14/2015 Document Reviewed: 12/14/2015 Elsevier Interactive Patient Education  2018 Elsevier Inc.  

## 2017-07-13 ENCOUNTER — Telehealth: Payer: Self-pay

## 2017-07-13 NOTE — Telephone Encounter (Signed)
Called and spoke to patient. Let him know that Elnita MaxwellCheryl was not in the office today so the letter would not be ready today. Patient states that was fine and he would pick it up Monday.

## 2017-07-13 NOTE — Telephone Encounter (Signed)
Copied from CRM [phone]. Topic: General - Other >> Jul 12, 2017  5:04 PM Trula SladeWalter, Linda F wrote: Reason for CRM:  Patient needs a letter stating this he is on blood pressure medication (name the medication) and that his BP is under control. He would like to pick it up tomorrow 07/13/17 if possible.   Routing to provider. Is it OK to type letter for the patient?

## 2017-07-13 NOTE — Telephone Encounter (Signed)
No problem, thanks.

## 2017-07-14 NOTE — Telephone Encounter (Signed)
Letter generated and ready for patient to pick up on Monday.

## 2017-09-07 ENCOUNTER — Ambulatory Visit: Payer: BLUE CROSS/BLUE SHIELD | Admitting: Unknown Physician Specialty

## 2017-09-08 ENCOUNTER — Ambulatory Visit: Payer: BLUE CROSS/BLUE SHIELD | Admitting: Unknown Physician Specialty

## 2017-09-22 ENCOUNTER — Ambulatory Visit: Payer: BLUE CROSS/BLUE SHIELD | Admitting: Unknown Physician Specialty

## 2017-10-27 ENCOUNTER — Ambulatory Visit (INDEPENDENT_AMBULATORY_CARE_PROVIDER_SITE_OTHER): Payer: BLUE CROSS/BLUE SHIELD | Admitting: Unknown Physician Specialty

## 2017-10-27 ENCOUNTER — Encounter: Payer: Self-pay | Admitting: Unknown Physician Specialty

## 2017-10-27 DIAGNOSIS — N183 Chronic kidney disease, stage 3 unspecified: Secondary | ICD-10-CM

## 2017-10-27 DIAGNOSIS — I10 Essential (primary) hypertension: Secondary | ICD-10-CM | POA: Diagnosis not present

## 2017-10-27 DIAGNOSIS — E7800 Pure hypercholesterolemia, unspecified: Secondary | ICD-10-CM

## 2017-10-27 IMAGING — CR DG LUMBAR SPINE COMPLETE 4+V
5 series · 5 of 5 positions shown · non-contrast
Comparison: None

CLINICAL DATA: Tripped over [place]

EXAM:
LUMBAR SPINE - COMPLETE 4+ VIEW

[l-spine ap]
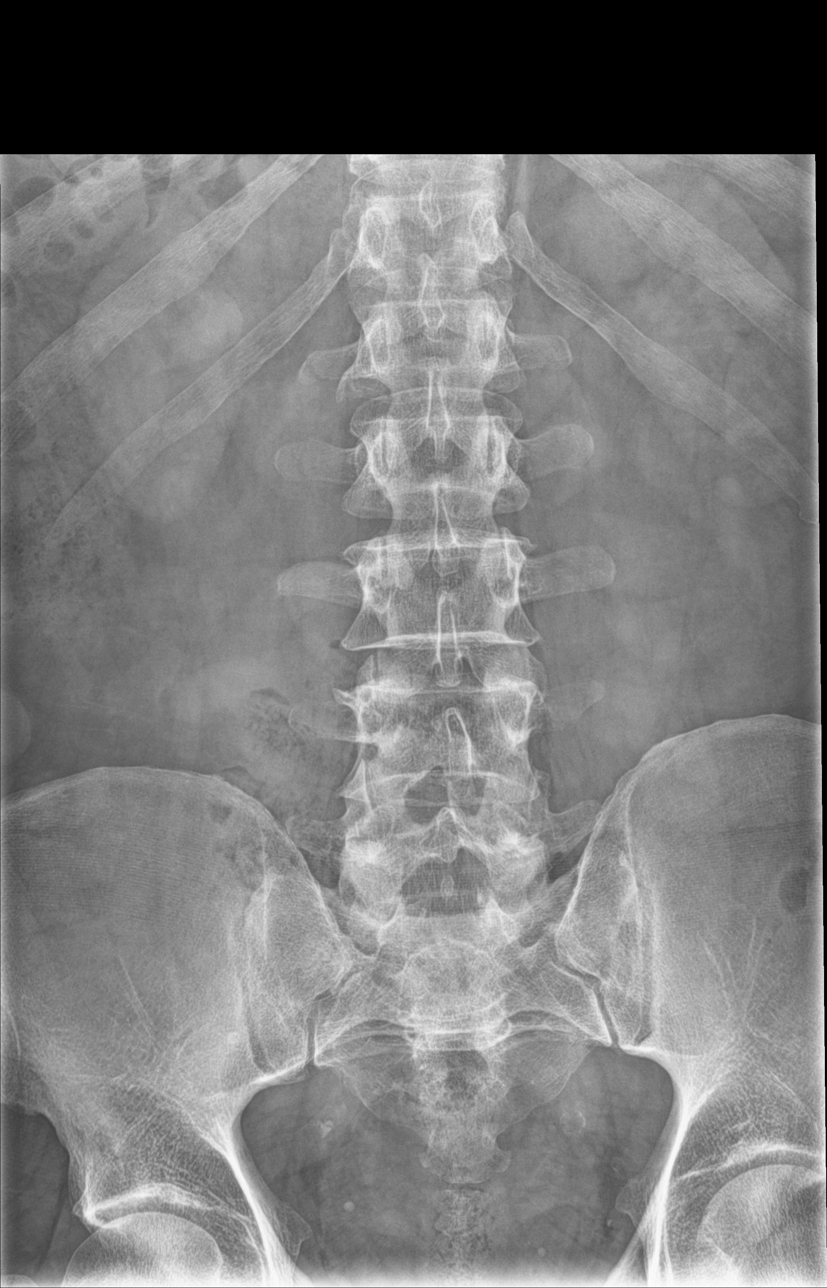

[l-spine obl (1 of 2)]
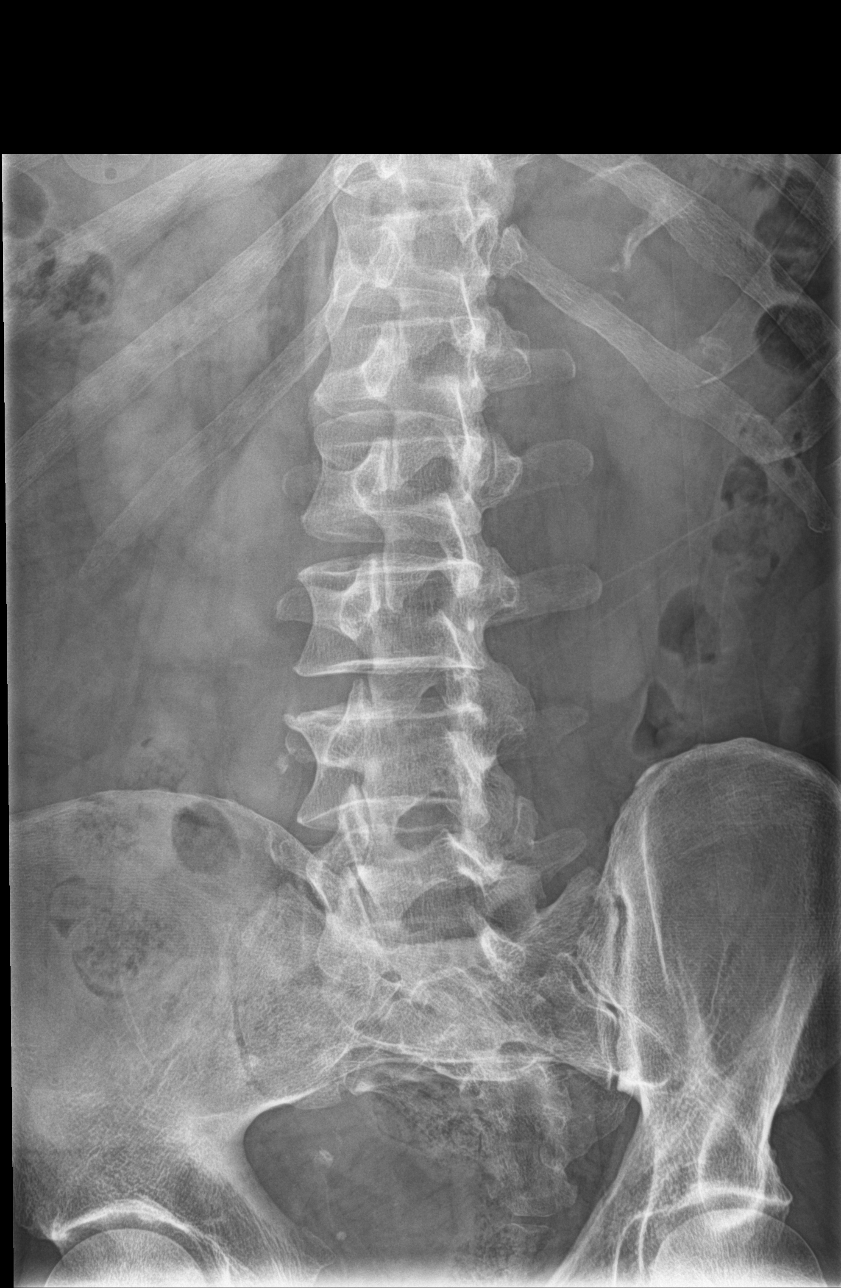

[l-spine obl (2 of 2)]
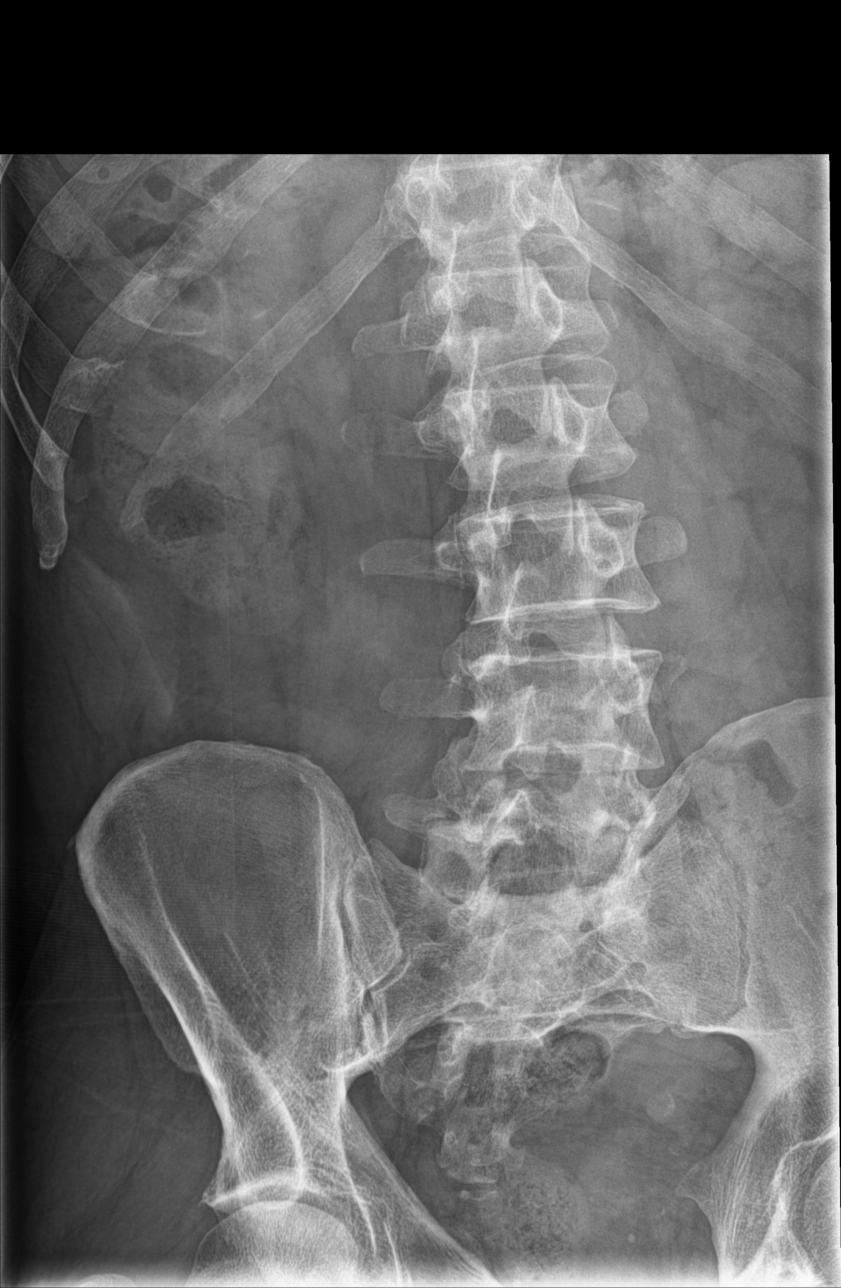

[l-spine lat]
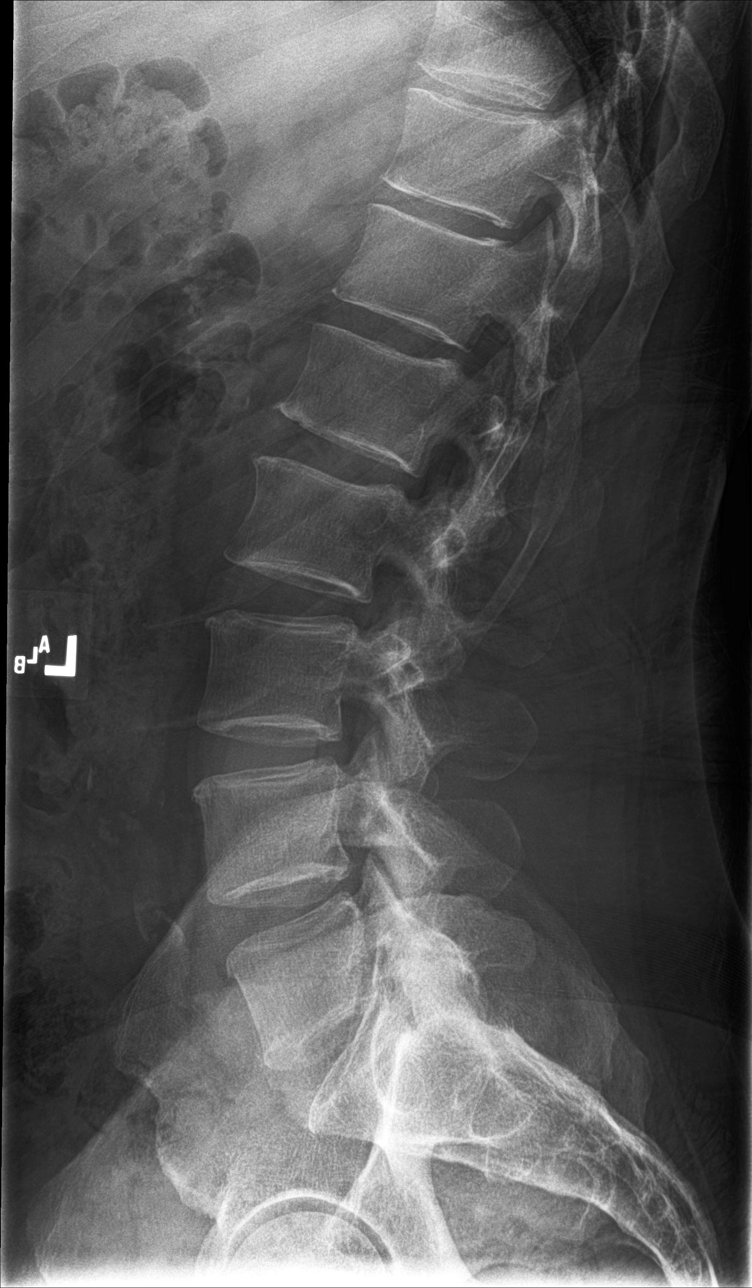

[l-spine spot]
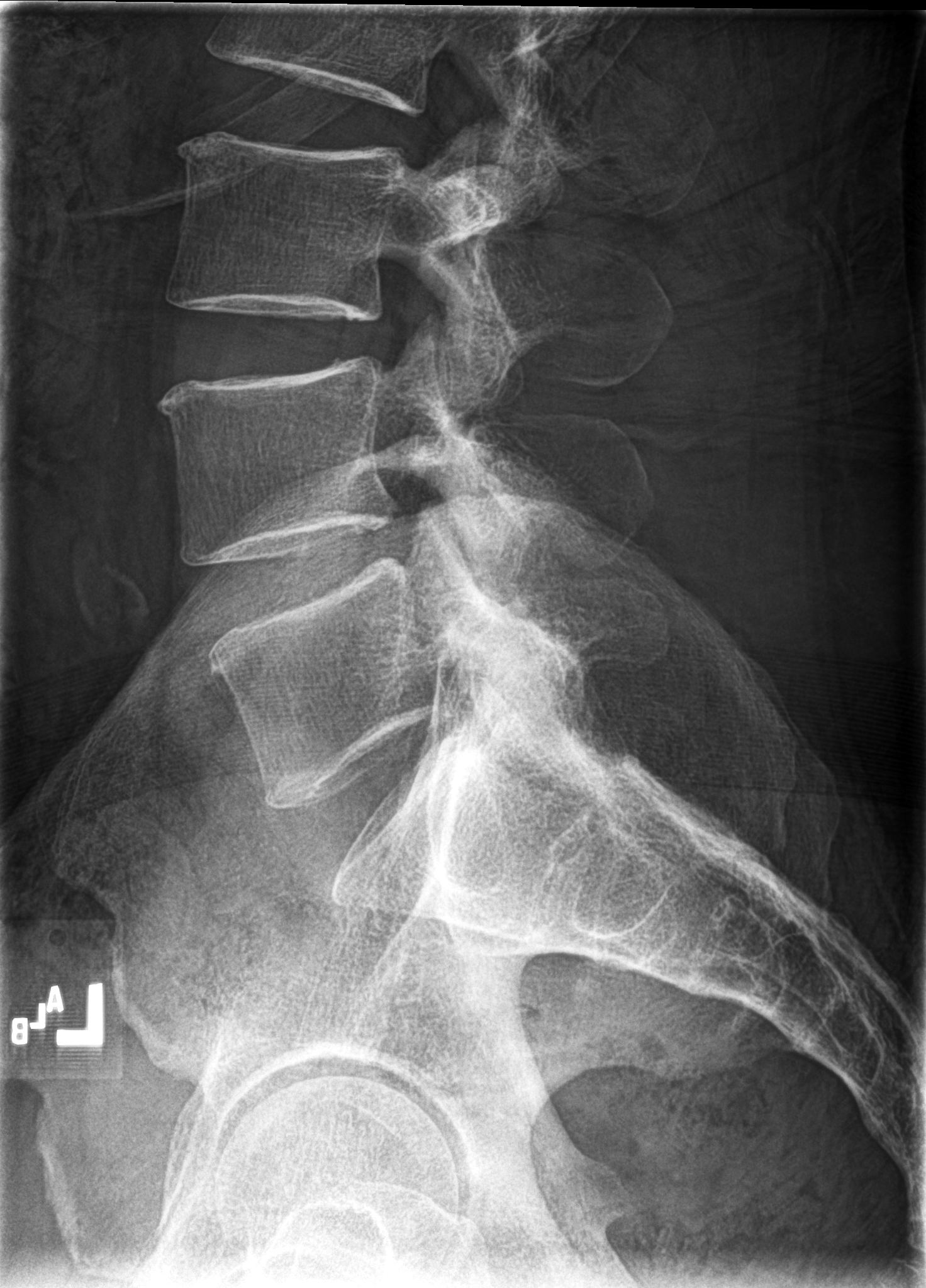

[5 of 5 positions shown; findings below may reference images not displayed]

FINDINGS: There is no evidence of lumbar spine fracture. Alignment is normal.
Intervertebral disc spaces are maintained.
IMPRESSION: Negative.

## 2017-10-27 NOTE — Progress Notes (Signed)
   BP (!) 144/88 (BP Location: Left Arm, Patient Position: Sitting, Cuff Size: Normal)   Pulse 68   Wt 202 lb 8 oz (91.9 kg)   SpO2 98%   BMI 31.91 kg/m    Subjective:    Patient ID: Brent Smith, male    DOB: 01-25-1955, 62 y.o.   MRN: [id number]  HPI: Brent Smith is a 62 y.o. male  Chief Complaint  Patient presents with  . Hypertension   Hypertension Using medications without difficulty Average home BPs 140s/80s   No problems or lightheadedness No chest pain with exertion or shortness of breath No Edema  CKD GFR 56 last visit.  Lost to f/u.  Recheck today  Hyperlipidemia Using medications without problems: No Muscle aches  Diet compliance: Watches diet.  Avoiding fast food.   Exercise: Physical work.    Relevant past medical, surgical, family and social history reviewed and updated as indicated. Interim medical history since our last visit reviewed. Allergies and medications reviewed and updated.  Review of Systems  Constitutional: Negative.   Respiratory: Negative.   Cardiovascular: Negative.   Skin: Negative.     Per HPI unless specifically indicated above     Objective:    BP (!) 144/88 (BP Location: Left Arm, Patient Position: Sitting, Cuff Size: Normal)   Pulse 68   Wt 202 lb 8 oz (91.9 kg)   SpO2 98%   BMI 31.91 kg/m   Wt Readings from Last 3 Encounters:  10/27/17 202 lb 8 oz (91.9 kg)  04/07/17 202 lb 9.6 oz (91.9 kg)  03/03/17 203 lb 3.2 oz (92.2 kg)    Physical Exam  Constitutional: He is oriented to person, place, and time. He appears well-developed and well-nourished. No distress.  HENT:  Head: Normocephalic and atraumatic.  Eyes: Conjunctivae and lids are normal. Right eye exhibits no discharge. Left eye exhibits no discharge. No scleral icterus.  Neck: Normal range of motion. Neck supple. No JVD present. Carotid bruit is not present.  Cardiovascular: Normal rate, regular rhythm and normal heart sounds.  Pulmonary/Chest: Effort  normal and breath sounds normal. No respiratory distress.  Abdominal: Normal appearance. There is no splenomegaly or hepatomegaly.  Musculoskeletal: Normal range of motion.  Neurological: He is alert and oriented to person, place, and time.  Skin: Skin is warm, dry and intact. No rash noted. No pallor.  Psychiatric: He has a normal mood and affect. His behavior is normal. Judgment and thought content normal.     Assessment & Plan:   Problem List Items Addressed This Visit      Unprioritized   Chronic kidney disease, stage 3 (HCC)    Check GFR today      Relevant Orders   Comprehensive metabolic panel   Hyperlipidemia    Stable, continue present medications.        Hypertension    BP usually lower at home.  Not quite to goal.  Discuss DASH diet and further weight loss efforts      Relevant Orders   Comprehensive metabolic panel       Follow up plan: Return in about 6 months (around 04/28/2018).

## 2017-10-27 NOTE — Assessment & Plan Note (Signed)
BP usually lower at home.  Not quite to goal.  Discuss DASH diet and further weight loss efforts

## 2017-10-27 NOTE — Assessment & Plan Note (Signed)
Check GFR today 

## 2017-10-27 NOTE — Assessment & Plan Note (Signed)
Stable, continue present medications.   

## 2017-10-28 LAB — COMPREHENSIVE METABOLIC PANEL
A/G RATIO: 2.1 (ref 1.2–2.2)
ALBUMIN: 4.4 g/dL (ref 3.6–4.8)
ALT: 14 IU/L (ref 0–44)
AST: 17 IU/L (ref 0–40)
Alkaline Phosphatase: 42 IU/L (ref 39–117)
BILIRUBIN TOTAL: 0.3 mg/dL (ref 0.0–1.2)
BUN / CREAT RATIO: 12 (ref 10–24)
BUN: 14 mg/dL (ref 8–27)
CALCIUM: 9.4 mg/dL (ref 8.6–10.2)
CO2: 22 mmol/L (ref 20–29)
Chloride: 107 mmol/L — ABNORMAL HIGH (ref 96–106)
Creatinine, Ser: 1.18 mg/dL (ref 0.76–1.27)
GFR, EST AFRICAN AMERICAN: 75 mL/min/{1.73_m2} (ref >59)
GFR, EST NON AFRICAN AMERICAN: 65 mL/min/{1.73_m2} (ref >59)
Globulin, Total: 2.1 g/dL (ref 1.5–4.5)
Glucose: 99 mg/dL (ref 65–99)
POTASSIUM: 5 mmol/L (ref 3.5–5.2)
Sodium: 144 mmol/L (ref 134–144)
TOTAL PROTEIN: 6.5 g/dL (ref 6.0–8.5)

## 2017-10-30 NOTE — Progress Notes (Signed)
Notified pt by mychart

## 2018-03-10 ENCOUNTER — Other Ambulatory Visit: Payer: Self-pay | Admitting: Unknown Physician Specialty

## 2018-03-12 NOTE — Telephone Encounter (Signed)
Requested medication (s) are due for refill today: yes  Requested medication (s) are on the active medication list: yes  Last refill:  03/03/2017 for 90 tabs and 3 refills  Future visit scheduled: yes  Notes to clinic:  Prescription expired on 03/03/2018. Ace inhibitors failed.  Requested Prescriptions  Pending Prescriptions Disp Refills   benazepril (LOTENSIN) 40 MG tablet [Pharmacy Med Name: BENAZEPRIL HCL 40 MG TAB] 90 tablet 3    Sig: TAKE 1 TABLET BY MOUTH A DAY     Cardiovascular:  ACE Inhibitors Failed - 03/10/2018 11:47 AM      Failed - Last BP in normal range    BP Readings from Last 1 Encounters:  10/27/17 (!) 144/88         Passed - Cr in normal range and within 180 days    Creatinine, Ser  Date Value Ref Range Status  10/27/2017 1.18 0.76 - 1.27 mg/dL Final         Passed - K in normal range and within 180 days    Potassium  Date Value Ref Range Status  10/27/2017 5.0 3.5 - 5.2 mmol/L Final         Passed - Patient is not pregnant      Passed - Valid encounter within last 6 months    Recent Outpatient Visits          4 months ago Essential hypertension   [facility] Gabriel Cirri, NP   11 months ago Essential hypertension   [facility] Gabriel Cirri, NP   1 year ago Annual physical exam   [facility] Gabriel Cirri, NP   1 year ago Gastroesophageal reflux disease with esophagitis   [facility] Gabriel Cirri, NP   2 years ago Need for diphtheria-tetanus-pertussis (Tdap) vaccine, adult/adolescent   [facility] Gabriel Cirri, NP      Future Appointments            In 1 month Cannady, Dorie Rank, NP [facility]

## 2018-04-13 ENCOUNTER — Other Ambulatory Visit: Payer: Self-pay | Admitting: Unknown Physician Specialty

## 2018-04-20 ENCOUNTER — Other Ambulatory Visit: Payer: Self-pay | Admitting: Nurse Practitioner

## 2018-04-20 DIAGNOSIS — E7800 Pure hypercholesterolemia, unspecified: Secondary | ICD-10-CM

## 2018-04-20 DIAGNOSIS — K219 Gastro-esophageal reflux disease without esophagitis: Secondary | ICD-10-CM

## 2018-04-20 DIAGNOSIS — I10 Essential (primary) hypertension: Secondary | ICD-10-CM

## 2018-04-26 ENCOUNTER — Other Ambulatory Visit: Payer: BLUE CROSS/BLUE SHIELD

## 2018-04-26 ENCOUNTER — Other Ambulatory Visit: Payer: Self-pay

## 2018-04-26 DIAGNOSIS — K219 Gastro-esophageal reflux disease without esophagitis: Secondary | ICD-10-CM

## 2018-04-26 DIAGNOSIS — I10 Essential (primary) hypertension: Secondary | ICD-10-CM

## 2018-04-26 DIAGNOSIS — E7800 Pure hypercholesterolemia, unspecified: Secondary | ICD-10-CM

## 2018-04-26 LAB — LIPID PANEL PICCOLO, WAIVED
Chol/HDL Ratio Piccolo,Waive: 3.9 mg/dL
Cholesterol Piccolo, Waived: 212 mg/dL — ABNORMAL HIGH (ref <200)
HDL Chol Piccolo, Waived: 54 mg/dL — ABNORMAL LOW (ref >59)
LDL Chol Calc Piccolo Waived: 139 mg/dL — ABNORMAL HIGH (ref <100)
Triglycerides Piccolo,Waived: 97 mg/dL (ref <150)
VLDL Chol Calc Piccolo,Waive: 19 mg/dL (ref <30)

## 2018-04-27 ENCOUNTER — Ambulatory Visit (INDEPENDENT_AMBULATORY_CARE_PROVIDER_SITE_OTHER): Payer: BLUE CROSS/BLUE SHIELD | Admitting: Nurse Practitioner

## 2018-04-27 ENCOUNTER — Encounter: Payer: Self-pay | Admitting: Nurse Practitioner

## 2018-04-27 VITALS — Ht 68.0 in | Wt 195.0 lb

## 2018-04-27 DIAGNOSIS — E7800 Pure hypercholesterolemia, unspecified: Secondary | ICD-10-CM | POA: Diagnosis not present

## 2018-04-27 DIAGNOSIS — N183 Chronic kidney disease, stage 3 unspecified: Secondary | ICD-10-CM

## 2018-04-27 DIAGNOSIS — I1310 Hypertensive heart and chronic kidney disease without heart failure, with stage 1 through stage 4 chronic kidney disease, or unspecified chronic kidney disease: Secondary | ICD-10-CM | POA: Diagnosis not present

## 2018-04-27 LAB — COMPREHENSIVE METABOLIC PANEL
ALT: 19 IU/L (ref 0–44)
AST: 17 IU/L (ref 0–40)
Albumin/Globulin Ratio: 1.6 (ref 1.2–2.2)
Albumin: 4.6 g/dL (ref 3.8–4.8)
Alkaline Phosphatase: 55 IU/L (ref 39–117)
BUN/Creatinine Ratio: 15 (ref 10–24)
BUN: 17 mg/dL (ref 8–27)
Bilirubin Total: 0.4 mg/dL (ref 0.0–1.2)
CO2: 21 mmol/L (ref 20–29)
Calcium: 10.1 mg/dL (ref 8.6–10.2)
Chloride: 102 mmol/L (ref 96–106)
Creatinine, Ser: 1.16 mg/dL (ref 0.76–1.27)
GFR calc Af Amer: 77 mL/min/{1.73_m2} (ref >59)
GFR calc non Af Amer: 66 mL/min/{1.73_m2} (ref >59)
Globulin, Total: 2.9 g/dL (ref 1.5–4.5)
Glucose: 90 mg/dL (ref 65–99)
Potassium: 4.7 mmol/L (ref 3.5–5.2)
Sodium: 143 mmol/L (ref 134–144)
Total Protein: 7.5 g/dL (ref 6.0–8.5)

## 2018-04-27 LAB — MAGNESIUM: Magnesium: 2.1 mg/dL (ref 1.6–2.3)

## 2018-04-27 MED ORDER — ATORVASTATIN CALCIUM 40 MG PO TABS: ORAL_TABLET | ORAL | 2 refills | Status: DC

## 2018-04-27 NOTE — Assessment & Plan Note (Signed)
Chronic, stable with GFR 77.  Continue current medication regimen, Lotensin for kidney protection.

## 2018-04-27 NOTE — Assessment & Plan Note (Signed)
Chronic, ongoing.  Not at goal LDL, although not consistently taking medication.  Continue current medication regimen.  Recommend taking all medications at night or using pill box to remember to take medications.

## 2018-04-27 NOTE — Progress Notes (Signed)
Ht 5\' 8"  (1.727 m)   Wt 195 lb (88.5 kg)   BMI 29.65 kg/m    Subjective:    Patient ID: Brent Smith, male    DOB: 08/25/1955, 63 y.o.   MRN: [id number]  HPI: Brent Smith is a 63 y.o. male  Chief Complaint  Patient presents with  . Hyperlipidemia    6 month F/U. No complaints.  . Hypertension  . Chronic Kidney Disease    . This visit was completed via WebEx due to the restrictions of the COVID-19 pandemic. All issues as above were discussed and addressed. Physical exam was done as above through visual confirmation on WebEx. If it was felt that the patient should be evaluated in the office, they were directed there. The patient verbally consented to this visit. . Location of the patient: home . Location of the provider: home . Those involved with this call:  . Provider: Aura DialsJolene Cannady, DNP . CMA: Myrtha MantisKeri Bullock, CMA . Front Desk/Registration: Adela Portshristan Williamson  . Time spent on call: 15 minutes with patient face to face via video conference. More than 50% of this time was spent in counseling and coordination of care. 10 minutes total spent in review of patient's record and preparation of their chart.   HYPERTENSION / HYPERLIPIDEMIA Current medications include Benazepril and Atorvastatin 40 MG.  He reports he takes his cholesterol medication, but "often forgets to take it".  Discussed importance of taking cholesterol medication on daily basis and reviewed current labs with him + discussed goals.   Satisfied with current treatment? yes Duration of hypertension: chronic BP monitoring frequency: rarely BP range:  120/80's, sometimes bottom number is 90's BP medication side effects: no Duration of hyperlipidemia: chronic Cholesterol medication side effects: no Cholesterol supplements: none Medication compliance: good compliance Aspirin: yes Recent stressors: no Recurrent headaches: no Visual changes: no Palpitations: no Dyspnea: no Chest pain: no Lower extremity  edema: no Dizzy/lightheaded: no   CHRONIC KIDNEY DISEASE CKD status: controlled Medications renally dose: yes Previous renal evaluation: no Pneumovax:  N/A Influenza Vaccine:  Up to Date  Relevant past medical, surgical, family and social history reviewed and updated as indicated. Interim medical history since our last visit reviewed. Allergies and medications reviewed and updated.  Review of Systems  Constitutional: Negative for activity change, diaphoresis, fatigue and fever.  Respiratory: Negative for cough, chest tightness, shortness of breath and wheezing.   Cardiovascular: Negative for chest pain, palpitations and leg swelling.  Gastrointestinal: Negative for abdominal distention, abdominal pain, constipation, diarrhea, nausea and vomiting.  Endocrine: Negative for cold intolerance, heat intolerance, polydipsia, polyphagia and polyuria.  Musculoskeletal: Negative.   Skin: Negative.   Neurological: Negative for dizziness, syncope, weakness, light-headedness, numbness and headaches.  Psychiatric/Behavioral: Negative.     Per HPI unless specifically indicated above     Objective:    Ht 5\' 8"  (1.727 m)   Wt 195 lb (88.5 kg)   BMI 29.65 kg/m   Wt Readings from Last 3 Encounters:  04/27/18 195 lb (88.5 kg)  10/27/17 202 lb 8 oz (91.9 kg)  04/07/17 202 lb 9.6 oz (91.9 kg)    Physical Exam Vitals signs and nursing note reviewed.  Constitutional:      General: He is awake.     Appearance: He is well-developed.  HENT:     Head: Normocephalic.     Right Ear: Hearing normal. No drainage.     Left Ear: Hearing normal. No drainage.     Nose: Nose normal.  Mouth/Throat:     Pharynx: Uvula midline.  Eyes:     General: Lids are normal.        Right eye: No discharge.        Left eye: No discharge.     Conjunctiva/sclera: Conjunctivae normal.  Neck:     Musculoskeletal: Normal range of motion.  Cardiovascular:     Comments: Unable to auscultate due to virtual visit  only Pulmonary:     Effort: Pulmonary effort is normal. No accessory muscle usage or respiratory distress.     Comments: Unable to auscultate due to virtual visit only Neurological:     Mental Status: He is alert and oriented to person, place, and time.  Psychiatric:        Mood and Affect: Mood normal.        Behavior: Behavior normal. Behavior is cooperative.        Thought Content: Thought content normal.        Judgment: Judgment normal.      Results for orders placed or performed in visit on 04/26/18  Magnesium  Result Value Ref Range   Magnesium 2.1 1.6 - 2.3 mg/dL  Lipid Panel Piccolo, Waived  Result Value Ref Range   Cholesterol Piccolo, Waived 212 (H) <200 mg/dL   HDL Chol Piccolo, Waived 54 (L) >59 mg/dL   Triglycerides Piccolo,Waived 97 <150 mg/dL   Chol/HDL Ratio Piccolo,Waive 3.9 mg/dL   LDL Chol Calc Piccolo Waived 139 (H) <100 mg/dL   VLDL Chol Calc Piccolo,Waive 19 <30 mg/dL  Comprehensive metabolic panel  Result Value Ref Range   Glucose 90 65 - 99 mg/dL   BUN 17 8 - 27 mg/dL   Creatinine, Ser 4.74 0.76 - 1.27 mg/dL   GFR calc non Af Amer 66 >59 mL/min/1.73   GFR calc Af Amer 77 >59 mL/min/1.73   BUN/Creatinine Ratio 15 10 - 24   Sodium 143 134 - 144 mmol/L   Potassium 4.7 3.5 - 5.2 mmol/L   Chloride 102 96 - 106 mmol/L   CO2 21 20 - 29 mmol/L   Calcium 10.1 8.6 - 10.2 mg/dL   Total Protein 7.5 6.0 - 8.5 g/dL   Albumin 4.6 3.8 - 4.8 g/dL   Globulin, Total 2.9 1.5 - 4.5 g/dL   Albumin/Globulin Ratio 1.6 1.2 - 2.2   Bilirubin Total 0.4 0.0 - 1.2 mg/dL   Alkaline Phosphatase 55 39 - 117 IU/L   AST 17 0 - 40 IU/L   ALT 19 0 - 44 IU/L      Assessment & Plan:   Problem List Items Addressed This Visit      Cardiovascular and Mediastinum   Hypertensive heart/kidney disease without HF and with CKD stage III (HCC) - Primary    Chronic, ongoing.  Unable to check BP today, cuff at his home.  Continue current medication regimen and adjust as needed based on  readings.  Recommend checking BP three mornings a week and documenting for provider review. GFR 77 on labs today.      Relevant Medications   atorvastatin (LIPITOR) 40 MG tablet     Genitourinary   Chronic kidney disease, stage 3 (HCC)    Chronic, stable with GFR 77.  Continue current medication regimen, Lotensin for kidney protection.          Other   Hyperlipidemia    Chronic, ongoing.  Not at goal LDL, although not consistently taking medication.  Continue current medication regimen.  Recommend taking all medications at  night or using pill box to remember to take medications.      Relevant Medications   atorvastatin (LIPITOR) 40 MG tablet      I discussed the assessment and treatment plan with the patient. The patient was provided an opportunity to ask questions and all were answered. The patient agreed with the plan and demonstrated an understanding of the instructions.   The patient was advised to call back or seek an in-person evaluation if the symptoms worsen or if the condition fails to improve as anticipated.   I provided 15 minutes of time during this encounter.  Follow up plan: Return in about 6 months (around 10/27/2018) for Annual Physical.

## 2018-04-27 NOTE — Assessment & Plan Note (Signed)
Chronic, ongoing.  Unable to check BP today, cuff at his home.  Continue current medication regimen and adjust as needed based on readings.  Recommend checking BP three mornings a week and documenting for provider review. GFR 77 on labs today.

## 2018-04-27 NOTE — Patient Instructions (Signed)
Fat and Cholesterol Restricted Eating Plan Getting too much fat and cholesterol in your diet may cause health problems. Choosing the right foods helps keep your fat and cholesterol at normal levels. This can keep you from getting certain diseases. Your doctor may recommend an eating plan that includes:  Total fat: ______% or less of total calories a day.  Saturated fat: ______% or less of total calories a day.  Cholesterol: less than _________mg a day.  Fiber: ______g a day. What are tips for following this plan? Meal planning  At meals, divide your plate into four equal parts: ? Fill one-half of your plate with vegetables and green salads. ? Fill one-fourth of your plate with whole grains. ? Fill one-fourth of your plate with low-fat (lean) protein foods.  Eat fish that is high in omega-3 fats at least two times a week. This includes mackerel, tuna, sardines, and salmon.  Eat foods that are high in fiber, such as whole grains, beans, apples, broccoli, carrots, peas, and barley. General tips   Work with your doctor to lose weight if you need to.  Avoid: ? Foods with added sugar. ? Fried foods. ? Foods with partially hydrogenated oils.  Limit alcohol intake to no more than 1 drink a day for nonpregnant women and 2 drinks a day for men. One drink equals 12 oz of beer, 5 oz of wine, or 1 oz of hard liquor. Reading food labels  Check food labels for: ? Trans fats. ? Partially hydrogenated oils. ? Saturated fat (g) in each serving. ? Cholesterol (mg) in each serving. ? Fiber (g) in each serving.  Choose foods with healthy fats, such as: ? Monounsaturated fats. ? Polyunsaturated fats. ? Omega-3 fats.  Choose grain products that have whole grains. Look for the word "whole" as the first word in the ingredient list. Cooking  Cook foods using low-fat methods. These include baking, boiling, grilling, and broiling.  Eat more home-cooked foods. Eat at restaurants and buffets  less often.  Avoid cooking using saturated fats, such as butter, cream, palm oil, palm kernel oil, and coconut oil. Recommended foods  Fruits  All fresh, canned (in natural juice), or frozen fruits. Vegetables  Fresh or frozen vegetables (raw, steamed, roasted, or grilled). Green salads. Grains  Whole grains, such as whole wheat or whole grain breads, crackers, cereals, and pasta. Unsweetened oatmeal, bulgur, barley, quinoa, or brown rice. Corn or whole wheat flour tortillas. Meats and other protein foods  Ground beef (85% or leaner), grass-fed beef, or beef trimmed of fat. Skinless chicken or turkey. Ground chicken or turkey. Pork trimmed of fat. All fish and seafood. Egg whites. Dried beans, peas, or lentils. Unsalted nuts or seeds. Unsalted canned beans. Nut butters without added sugar or oil. Dairy  Low-fat or nonfat dairy products, such as skim or 1% milk, 2% or reduced-fat cheeses, low-fat and fat-free ricotta or cottage cheese, or plain low-fat and nonfat yogurt. Fats and oils  Tub margarine without trans fats. Light or reduced-fat mayonnaise and salad dressings. Avocado. Olive, canola, sesame, or safflower oils. The items listed above may not be a complete list of foods and beverages you can eat. Contact a dietitian for more information. Foods to avoid Fruits  Canned fruit in heavy syrup. Fruit in cream or butter sauce. Fried fruit. Vegetables  Vegetables cooked in cheese, cream, or butter sauce. Fried vegetables. Grains  White bread. White pasta. White rice. Cornbread. Bagels, pastries, and croissants. Crackers and snack foods that contain trans fat   and hydrogenated oils. Meats and other protein foods  Fatty cuts of meat. Ribs, chicken wings, bacon, sausage, bologna, salami, chitterlings, fatback, hot dogs, bratwurst, and packaged lunch meats. Liver and organ meats. Whole eggs and egg yolks. Chicken and turkey with skin. Fried meat. Dairy  Whole or 2% milk, cream,  half-and-half, and cream cheese. Whole milk cheeses. Whole-fat or sweetened yogurt. Full-fat cheeses. Nondairy creamers and whipped toppings. Processed cheese, cheese spreads, and cheese curds. Beverages  Alcohol. Sugar-sweetened drinks such as sodas, lemonade, and fruit drinks. Fats and oils  Butter, stick margarine, lard, shortening, ghee, or bacon fat. Coconut, palm kernel, and palm oils. Sweets and desserts  Corn syrup, sugars, honey, and molasses. Candy. Jam and jelly. Syrup. Sweetened cereals. Cookies, pies, cakes, donuts, muffins, and ice cream. The items listed above may not be a complete list of foods and beverages you should avoid. Contact a dietitian for more information. Summary  Choosing the right foods helps keep your fat and cholesterol at normal levels. This can keep you from getting certain diseases.  At meals, fill one-half of your plate with vegetables and green salads.  Eat high-fiber foods, like whole grains, beans, apples, carrots, peas, and barley.  Limit added sugar, saturated fats, alcohol, and fried foods. This information is not intended to replace advice given to you by your health care provider. Make sure you discuss any questions you have with your health care provider. Document Released: 06/21/2011 Document Revised: 08/23/2017 Document Reviewed: 09/06/2016 Elsevier Interactive Patient Education  2019 [employer]   

## 2018-08-01 ENCOUNTER — Other Ambulatory Visit: Payer: Self-pay | Admitting: Unknown Physician Specialty

## 2018-08-02 ENCOUNTER — Telehealth: Payer: Self-pay | Admitting: Unknown Physician Specialty

## 2018-08-03 ENCOUNTER — Other Ambulatory Visit: Payer: Self-pay | Admitting: Nurse Practitioner

## 2018-08-03 MED ORDER — OMEPRAZOLE 20 MG PO CPDR: 20.0000 mg | DELAYED_RELEASE_CAPSULE | Freq: Every day | ORAL | 3 refills | Status: DC

## 2018-08-03 NOTE — Telephone Encounter (Signed)
Sent script

## 2018-08-03 NOTE — Telephone Encounter (Signed)
Pt following up on request for omeprazole (PRILOSEC) 20 MG capsule   Pt states he did virtual visit in April, and was asked if he needed, he said he would call back when time to refill.  [place], [place] [phone] (Phone) [phone] (Fax)

## 2018-08-03 NOTE — Telephone Encounter (Signed)
Routing to provider  

## 2018-10-22 ENCOUNTER — Encounter: Payer: Self-pay | Admitting: Nurse Practitioner

## 2018-10-22 ENCOUNTER — Other Ambulatory Visit: Payer: Self-pay

## 2018-10-22 ENCOUNTER — Ambulatory Visit (INDEPENDENT_AMBULATORY_CARE_PROVIDER_SITE_OTHER): Payer: BC Managed Care – PPO | Admitting: Nurse Practitioner

## 2018-10-22 VITALS — BP 130/82 | HR 71 | Temp 98.0°F | Ht 68.0 in | Wt 191.4 lb

## 2018-10-22 DIAGNOSIS — I1310 Hypertensive heart and chronic kidney disease without heart failure, with stage 1 through stage 4 chronic kidney disease, or unspecified chronic kidney disease: Secondary | ICD-10-CM | POA: Diagnosis not present

## 2018-10-22 DIAGNOSIS — G8929 Other chronic pain: Secondary | ICD-10-CM

## 2018-10-22 DIAGNOSIS — N1831 Chronic kidney disease, stage 3a: Secondary | ICD-10-CM

## 2018-10-22 DIAGNOSIS — M545 Low back pain, unspecified: Secondary | ICD-10-CM | POA: Insufficient documentation

## 2018-10-22 DIAGNOSIS — E7800 Pure hypercholesterolemia, unspecified: Secondary | ICD-10-CM

## 2018-10-22 DIAGNOSIS — R7301 Impaired fasting glucose: Secondary | ICD-10-CM | POA: Diagnosis not present

## 2018-10-22 DIAGNOSIS — M5441 Lumbago with sciatica, right side: Secondary | ICD-10-CM

## 2018-10-22 DIAGNOSIS — Z0000 Encounter for general adult medical examination without abnormal findings: Secondary | ICD-10-CM

## 2018-10-22 DIAGNOSIS — K219 Gastro-esophageal reflux disease without esophagitis: Secondary | ICD-10-CM

## 2018-10-22 LAB — MICROALBUMIN, URINE WAIVED
Creatinine, Urine Waived: 100 mg/dL (ref 10–300)
Microalb, Ur Waived: 10 mg/L (ref 0–19)
Microalb/Creat Ratio: <30 mg/g (ref <30)

## 2018-10-22 MED ORDER — PREDNISONE 10 MG PO TABS: ORAL_TABLET | ORAL | 0 refills | Status: DC

## 2018-10-22 MED ORDER — METHOCARBAMOL 500 MG PO TABS: 500.0000 mg | ORAL_TABLET | Freq: Two times a day (BID) | ORAL | 0 refills | Status: DC | PRN

## 2018-10-22 MED ORDER — BENAZEPRIL HCL 40 MG PO TABS: ORAL_TABLET | ORAL | 3 refills | Status: DC

## 2018-10-22 MED ORDER — ATORVASTATIN CALCIUM 40 MG PO TABS: ORAL_TABLET | ORAL | 2 refills | Status: DC

## 2018-10-22 NOTE — Assessment & Plan Note (Signed)
Chronic, ongoing.  Initial BP elevated, but repeat at goal. Continue current medication regimen and adjust as needed based on readings.  Recommend checking BP three mornings a week and documenting for provider review. Labs today.  Return in 6 months.

## 2018-10-22 NOTE — Assessment & Plan Note (Signed)
Check A1C today and adjust regimen as needed.

## 2018-10-22 NOTE — Assessment & Plan Note (Signed)
Chronic, ongoing.  Continue current medication regimen and adjust as needed.  Lipid panel and CMP today.    

## 2018-10-22 NOTE — Progress Notes (Signed)
BP 130/82 (BP Location: Left Arm)   Pulse 71   Temp 98 F (36.7 C) (Oral)   Ht  (1.727 m)   Wt 191 lb 6.4 oz (86.8 kg)   SpO2 99%   BMI 29.10 kg/m    Subjective:    Patient ID: Brent Smith, male    DOB: 1955/02/25, 63 y.o.   MRN: [id number]  HPI: Brent Smith is a 63 y.o. male presenting on 10/22/2018 for comprehensive medical examination. Current medical complaints include: back pain  He currently lives with: significant other Interim Problems from his last visit: no   HYPERTENSION / HYPERLIPIDEMIA Continues on Benazepril and Lipitor. Satisfied with current treatment? yes Duration of hypertension: chronic BP monitoring frequency: not checking BP range:  BP medication side effects: no Duration of hyperlipidemia: chronic Cholesterol medication side effects: no Cholesterol supplements: none Medication compliance: good compliance Aspirin: yes Recent stressors: no Recurrent headaches: no Visual changes: no Palpitations: no Dyspnea: no Chest pain: no Lower extremity edema: no Dizzy/lightheaded: no   CHRONIC KIDNEY DISEASE Is on Benazepril. CKD status: stable Medications renally dose: yes Previous renal evaluation: no Pneumovax:  not applicable Influenza Vaccine:  yes  GERD Continues on Prilosec daily.  Well-controlled.  GERD control status: stable  Satisfied with current treatment? yes Dysphagia: no Odynophagia:  no Hematemesis: no Blood in stool: no EGD: no   IFG: History of elevated glucose and A1C last in 2017 5.9% Hypoglycemic episodes:no Polydipsia/polyuria: no Visual disturbance: no Chest pain: no Paresthesias: no   BACK PAIN Is a truck driver, physically unload trucks, [employer] cakes.  Drives about 6-8 hours at a time.  Has been having pain in lower back and at times upper back, sometimes to hip and then down lower right leg.  Ongoing for one year, but has been taking Ibuprofen and Tylenol + occasional Aleeve.  Tries not to  take anything unless really severe.   Duration: months Mechanism of injury: lifting Location: R>L, low back and upper back Onset: gradual Severity: 8/10 at worst, sometimes unbearable Quality: sharp, dull, aching and burning Frequency: intermittent Radiation: R leg above the knee Aggravating factors: lifting, movement, bending and prolonged sitting Alleviating factors: heat, NSAIDs and APAP Status: fluctuating Treatments attempted: heat, APAP and ibuprofen  Relief with NSAIDs?: moderate Nighttime pain:  no Paresthesias / decreased sensation:  no Bowel / bladder incontinence:  no Fevers:  no Dysuria / urinary frequency:  no  Functional Status Survey: Is the patient deaf or have difficulty hearing?: No Does the patient have difficulty seeing, even when wearing glasses/contacts?: No Does the patient have difficulty concentrating, remembering, or making decisions?: No Does the patient have difficulty walking or climbing stairs?: No Does the patient have difficulty dressing or bathing?: No Does the patient have difficulty doing errands alone such as visiting a doctor's office or shopping?: No  FALL RISK: Fall Risk  10/22/2018 03/03/2017 10/26/2015  Falls in the past year? 0 No Yes  Number falls in past yr: 0 - 1  Injury with Fall? 0 - No  Follow up Falls evaluation completed - -    Depression Screen Depression screen [facility] 2/9 10/22/2018 03/03/2017 10/26/2015  Decreased Interest 0 0 0  Down, Depressed, Hopeless 0 0 0  PHQ - 2 Score 0 0 0  Altered sleeping - - 0  Tired, decreased energy - - 0  Change in appetite - - 0  Feeling bad or failure about yourself  - - 0  Trouble concentrating - -  0  Moving slowly or fidgety/restless - - 0  Suicidal thoughts - - 0  PHQ-9 Score - - 0    Advanced Directives <no information>  Past Medical History:  Past Medical History:  Diagnosis Date  . GERD (gastroesophageal reflux disease)   . Hyperlipidemia   . Hypertension     Surgical  History:  Past Surgical History:  Procedure Laterality Date  . SHOULDER SURGERY  2009  . TONSILLECTOMY      Medications:  Current Outpatient Medications on File Prior to Visit  Medication Sig  . aspirin 81 MG tablet Take 81 mg by mouth daily.  Marland Kitchen atorvastatin (LIPITOR) 40 MG tablet TAKE 1 TABLET BY MOUTH BEDTIME  . benazepril (LOTENSIN) 40 MG tablet TAKE 1 TABLET BY MOUTH A DAY  . omeprazole (PRILOSEC) 20 MG capsule Take 1 capsule (20 mg total) by mouth daily.  Marland Kitchen scopolamine (TRANSDERM-SCOP) 1 MG/3DAYS Place 1 patch (1.5 mg total) onto the skin every 3 (three) days.   No current facility-administered medications on file prior to visit.     Allergies:  No Known Allergies  Social History:  Social History   Socioeconomic History  . Marital status: Married    Spouse name: Not on file  . Number of children: Not on file  . Years of education: Not on file  . Highest education level: Not on file  Occupational History  . Not on file  Social Needs  . Financial resource strain: Not on file  . Food insecurity    Worry: Not on file    Inability: Not on file  . Transportation needs    Medical: Not on file    Non-medical: Not on file  Tobacco Use  . Smoking status: Former Games developer  . Smokeless tobacco: Never Used  Substance and Sexual Activity  . Alcohol use: Yes    Comment: pt states on occasion or on vacation  . Drug use: No  . Sexual activity: Yes  Lifestyle  . Physical activity    Days per week: Not on file    Minutes per session: Not on file  . Stress: Not on file  Relationships  . Social Musician on phone: Not on file    Gets together: Not on file    Attends religious service: Not on file    Active member of club or organization: Not on file    Attends meetings of clubs or organizations: Not on file    Relationship status: Not on file  . Intimate partner violence    Fear of current or ex partner: Not on file    Emotionally abused: Not on file     Physically abused: Not on file    Forced sexual activity: Not on file  Other Topics Concern  . Not on file  Social History Narrative  . Not on file   Social History   Tobacco Use  Smoking Status Former Smoker  Smokeless Tobacco Never Used   Social History   Substance and Sexual Activity  Alcohol Use Yes   Comment: pt states on occasion or on vacation    Family History:  Family History  Problem Relation Age of Onset  . Hypertension Mother   . Parkinson's disease Mother   . Hypertension Father   . Dementia Father   . Hypertension Sister   . Hypertension Brother   . Hypertension Maternal Grandfather     Past medical history, surgical history, medications, allergies, family history and social history  reviewed with patient today and changes made to appropriate areas of the chart.   Review of Systems -  Back pain All other ROS negative except what is listed above and in the HPI.      Objective:    BP 130/82 (BP Location: Left Arm)   Pulse 71   Temp 98 F (36.7 C) (Oral)   Ht 5\' 8"  (1.727 m)   Wt 191 lb 6.4 oz (86.8 kg)   SpO2 99%   BMI 29.10 kg/m   Wt Readings from Last 3 Encounters:  10/22/18 191 lb 6.4 oz (86.8 kg)  04/27/18 195 lb (88.5 kg)  10/27/17 202 lb 8 oz (91.9 kg)    Physical Exam Vitals signs and nursing note reviewed.  Constitutional:      General: He is awake. He is not in acute distress.    Appearance: He is well-developed and overweight. He is not ill-appearing.  HENT:     Head: Normocephalic and atraumatic.     Right Ear: Hearing, tympanic membrane, ear canal and external ear normal. No drainage.     Left Ear: Hearing, tympanic membrane, ear canal and external ear normal. No drainage.     Nose: Nose normal.     Mouth/Throat:     Mouth: Mucous membranes are moist.     Pharynx: Uvula midline.  Eyes:     General: Lids are normal.        Right eye: No discharge.        Left eye: No discharge.     Extraocular Movements: Extraocular  movements intact.     Conjunctiva/sclera: Conjunctivae normal.     Pupils: Pupils are equal, round, and reactive to light.     Visual Fields: Right eye visual fields normal and left eye visual fields normal.  Neck:     Musculoskeletal: Normal range of motion and neck supple.     Thyroid: No thyromegaly.     Vascular: No carotid bruit or JVD.  Cardiovascular:     Rate and Rhythm: Normal rate and regular rhythm.     Heart sounds: Normal heart sounds, S1 normal and S2 normal. No murmur. No gallop.   Pulmonary:     Effort: Pulmonary effort is normal. No accessory muscle usage or respiratory distress.     Breath sounds: Normal breath sounds.  Abdominal:     General: Bowel sounds are normal.     Palpations: Abdomen is soft. There is no hepatomegaly or splenomegaly.     Tenderness: There is no abdominal tenderness.  Musculoskeletal: Normal range of motion.     Lumbar back: He exhibits tenderness and pain. He exhibits normal range of motion, no bony tenderness, no swelling, no edema and no laceration.     Right lower leg: No edema.     Left lower leg: No edema.     Comments: Mild tenderness on palpation bilateral lower back.  Pain noted with flexion and rotation, R>L.  Able to touch toes without issue.  Negative straight leg.  No rashes noted.  Lymphadenopathy:     Head:     Right side of head: No submental, submandibular, tonsillar, preauricular or posterior auricular adenopathy.     Left side of head: No submental, submandibular, tonsillar, preauricular or posterior auricular adenopathy.     Cervical: No cervical adenopathy.  Skin:    General: Skin is warm and dry.     Capillary Refill: Capillary refill takes less than 2 seconds.     Findings: No  rash.  Neurological:     Mental Status: He is alert and oriented to person, place, and time.     Cranial Nerves: Cranial nerves are intact.     Gait: Gait is intact.     Deep Tendon Reflexes: Reflexes are normal and symmetric.     Reflex  Scores:      Brachioradialis reflexes are 2+ on the right side and 2+ on the left side.      Patellar reflexes are 2+ on the right side and 2+ on the left side. Psychiatric:        Attention and Perception: Attention normal.        Mood and Affect: Mood normal.        Speech: Speech normal.        Behavior: Behavior normal. Behavior is cooperative.        Thought Content: Thought content normal.        Cognition and Memory: Cognition normal.        Judgment: Judgment normal.      6CIT Screen 10/22/2018  What Year? 0 points  What month? 0 points  What time? 0 points  Count back from 20 0 points  Months in reverse 0 points  Repeat phrase 0 points  Total Score 0    Results for orders placed or performed in visit on 04/26/18  Magnesium  Result Value Ref Range   Magnesium 2.1 1.6 - 2.3 mg/dL  Lipid Panel Piccolo, Waived  Result Value Ref Range   Cholesterol Piccolo, Waived 212 (H) <200 mg/dL   HDL Chol Piccolo, Waived 54 (L) >59 mg/dL   Triglycerides Piccolo,Waived 97 <150 mg/dL   Chol/HDL Ratio Piccolo,Waive 3.9 mg/dL   LDL Chol Calc Piccolo Waived 139 (H) <100 mg/dL   VLDL Chol Calc Piccolo,Waive 19 <30 mg/dL  Comprehensive metabolic panel  Result Value Ref Range   Glucose 90 65 - 99 mg/dL   BUN 17 8 - 27 mg/dL   Creatinine, Ser 1.16 0.76 - 1.27 mg/dL   GFR calc non Af Amer 66 >59 mL/min/1.73   GFR calc Af Amer 77 >59 mL/min/1.73   BUN/Creatinine Ratio 15 10 - 24   Sodium 143 134 - 144 mmol/L   Potassium 4.7 3.5 - 5.2 mmol/L   Chloride 102 96 - 106 mmol/L   CO2 21 20 - 29 mmol/L   Calcium 10.1 8.6 - 10.2 mg/dL   Total Protein 7.5 6.0 - 8.5 g/dL   Albumin 4.6 3.8 - 4.8 g/dL   Globulin, Total 2.9 1.5 - 4.5 g/dL   Albumin/Globulin Ratio 1.6 1.2 - 2.2   Bilirubin Total 0.4 0.0 - 1.2 mg/dL   Alkaline Phosphatase 55 39 - 117 IU/L   AST 17 0 - 40 IU/L   ALT 19 0 - 44 IU/L      Assessment & Plan:   Problem List Items Addressed This Visit      Cardiovascular and  Mediastinum   Hypertensive heart/kidney disease without HF and with CKD stage III - Primary    Chronic, ongoing.  Initial BP elevated, but repeat at goal. Continue current medication regimen and adjust as needed based on readings.  Recommend checking BP three mornings a week and documenting for provider review. Labs today.  Return in 6 months.      Relevant Orders   Microalbumin, Urine Waived     Digestive   GERD (gastroesophageal reflux disease)    Chronic, stable.  Continue current medication regimen and check Mag  level today.        Endocrine   Impaired fasting blood sugar    Check A1C today and adjust regimen as needed.      Relevant Orders   HgB A1c     Nervous and Auditory   Chronic right-sided low back pain with right-sided sciatica    Ongoing, intermittent with radiculopathy to right x one year.  Will obtain lumbar spine imaging.  Prednisone taper and Robaxin PRN scripts sent.  Is aware not to take Robaxin while driving.  Recommend reducing Ibuprofen intake, switch to Tylenol as needed.  Alternate heat/ice and gentle stretching.  Consider chiropractor or PT based on imaging results.  Return for worsening or continued pain.      Relevant Medications   predniSONE (DELTASONE) 10 MG tablet   methocarbamol (ROBAXIN) 500 MG tablet   Other Relevant Orders   DG Lumbar Spine Complete     Genitourinary   Chronic kidney disease, stage 3    Chronic, stable.  Continue Benazepril for kidney protection and adjust as needed.  CMP today.        Other   Hyperlipidemia    Chronic, ongoing.  Continue current medication regimen and adjust as needed.  Lipid panel and CMP today.      Relevant Orders   Comprehensive metabolic panel   Lipid Panel w/o Chol/HDL Ratio    Other Visit Diagnoses    Annual physical exam       Relevant Orders   CBC with Differential/Platelet   PSA   TSH      Discussed aspirin prophylaxis for myocardial infarction prevention and decision was it was not  indicated  LABORATORY TESTING:  Health maintenance labs ordered today as discussed above.   The natural history of prostate cancer and ongoing controversy regarding screening and potential treatment outcomes of prostate cancer has been discussed with the patient. The meaning of a false positive PSA and a false negative PSA has been discussed. He indicates understanding of the limitations of this screening test and wishes to proceed with screening PSA testing.   IMMUNIZATIONS:   - Tdap: Tetanus vaccination status reviewed: last tetanus booster within 10 years. - Influenza: Up to date - Pneumovax: Not applicable - Prevnar: Not applicable - Zostavax vaccine:  Wishes to think about this  SCREENING: - Colonoscopy: Up to date  Discussed with patient purpose of the colonoscopy is to detect colon cancer at curable precancerous or early stages   - AAA Screening: Not applicable  -Hearing Test: Not applicable  -Spirometry: Not applicable   PATIENT COUNSELING:    Sexuality: Discussed sexually transmitted diseases, partner selection, use of condoms, avoidance of unintended pregnancy  and contraceptive alternatives.   Advised to avoid cigarette smoking.  I discussed with the patient that most people either abstain from alcohol or drink within safe limits (<=14/week and <=4 drinks/occasion for males, <=7/weeks and <= 3 drinks/occasion for females) and that the risk for alcohol disorders and other health effects rises proportionally with the number of drinks per week and how often a drinker exceeds daily limits.  Discussed cessation/primary prevention of drug use and availability of treatment for abuse.   Diet: Encouraged to adjust caloric intake to maintain  or achieve ideal body weight, to reduce intake of dietary saturated fat and total fat, to limit sodium intake by avoiding high sodium foods and not adding table salt, and to maintain adequate dietary potassium and calcium preferably from fresh  fruits, vegetables, and low-fat dairy products.  stressed the importance of regular exercise  Injury prevention: Discussed safety belts, safety helmets, smoke detector, smoking near bedding or upholstery.   Dental health: Discussed importance of regular tooth brushing, flossing, and dental visits.   Follow up plan: NEXT PREVENTATIVE PHYSICAL DUE IN 1 YEAR. Return in about 6 months (around 04/22/2019) for HTN/HLD.

## 2018-10-22 NOTE — Assessment & Plan Note (Signed)
Chronic, stable.  Continue current medication regimen and check Mag level today. 

## 2018-10-22 NOTE — Assessment & Plan Note (Signed)
Ongoing, intermittent with radiculopathy to right x one year.  Will obtain lumbar spine imaging.  Prednisone taper and Robaxin PRN scripts sent.  Is aware not to take Robaxin while driving.  Recommend reducing Ibuprofen intake, switch to Tylenol as needed.  Alternate heat/ice and gentle stretching.  Consider chiropractor or PT based on imaging results.  Return for worsening or continued pain.

## 2018-10-22 NOTE — Patient Instructions (Addendum)
[address], [place], [place] [id number]  DASH Eating Plan DASH stands for "Dietary Approaches to Stop Hypertension." The DASH eating plan is a healthy eating plan that has been shown to reduce high blood pressure (hypertension). It may also reduce your risk for type 2 diabetes, heart disease, and stroke. The DASH eating plan may also help with weight loss. What are tips for following this plan?  General guidelines  Avoid eating more than 2,300 mg (milligrams) of salt (sodium) a day. If you have hypertension, you may need to reduce your sodium intake to 1,500 mg a day.  Limit alcohol intake to no more than 1 drink a day for nonpregnant women and 2 drinks a day for men. One drink equals 12 oz of beer, 5 oz of wine, or 1 oz of hard liquor.  Work with your health care provider to maintain a healthy body weight or to lose weight. Ask what an ideal weight is for you.  Get at least 30 minutes of exercise that causes your heart to beat faster (aerobic exercise) most days of the week. Activities may include walking, swimming, or biking.  Work with your health care provider or diet and nutrition specialist (dietitian) to adjust your eating plan to your individual calorie needs. Reading food labels   Check food labels for the amount of sodium per serving. Choose foods with less than 5 percent of the Daily Value of sodium. Generally, foods with less than 300 mg of sodium per serving fit into this eating plan.  To find whole grains, look for the word "whole" as the first word in the ingredient list. Shopping  Buy products labeled as "low-sodium" or "no salt added."  Buy fresh foods. Avoid canned foods and premade or frozen meals. Cooking  Avoid adding salt when cooking. Use salt-free seasonings or herbs instead of table salt or sea salt. Check with your health care provider or pharmacist before using salt substitutes.  Do not fry foods. Cook foods using healthy methods such as baking,  boiling, grilling, and broiling instead.  Cook with heart-healthy oils, such as olive, canola, soybean, or sunflower oil. Meal planning  Eat a balanced diet that includes: ? 5 or more servings of fruits and vegetables each day. At each meal, try to fill half of your plate with fruits and vegetables. ? Up to 6-8 servings of whole grains each day. ? Less than 6 oz of lean meat, poultry, or fish each day. A 3-oz serving of meat is about the same size as a deck of cards. One egg equals 1 oz. ? 2 servings of low-fat dairy each day. ? A serving of nuts, seeds, or beans 5 times each week. ? Heart-healthy fats. Healthy fats called Omega-3 fatty acids are found in foods such as flaxseeds and coldwater fish, like sardines, salmon, and mackerel.  Limit how much you eat of the following: ? Canned or prepackaged foods. ? Food that is high in trans fat, such as fried foods. ? Food that is high in saturated fat, such as fatty meat. ? Sweets, desserts, sugary drinks, and other foods with added sugar. ? Full-fat dairy products.  Do not salt foods before eating.  Try to eat at least 2 vegetarian meals each week.  Eat more home-cooked food and less restaurant, buffet, and fast food.  When eating at a restaurant, ask that your food be prepared with less salt or no salt, if possible. What foods are recommended? The items listed may not  be a complete list. Talk with your dietitian about what dietary choices are best for you. Grains Whole-grain or whole-wheat bread. Whole-grain or whole-wheat pasta. Brown rice. [place]. Bulgur. Whole-grain and low-sodium cereals. Pita bread. Low-fat, low-sodium crackers. Whole-wheat flour tortillas. Vegetables Fresh or frozen vegetables (raw, steamed, roasted, or grilled). Low-sodium or reduced-sodium tomato and vegetable juice. Low-sodium or reduced-sodium tomato sauce and tomato paste. Low-sodium or reduced-sodium canned vegetables. Fruits All fresh, dried, or  frozen fruit. Canned fruit in natural juice (without added sugar). Meat and other protein foods Skinless chicken or [place]. Ground chicken or [place]. Pork with fat trimmed off. Fish and seafood. Egg whites. Dried beans, peas, or lentils. Unsalted nuts, nut butters, and seeds. Unsalted canned beans. Lean cuts of beef with fat trimmed off. Low-sodium, lean deli meat. Dairy Low-fat (1%) or fat-free (skim) milk. Fat-free, low-fat, or reduced-fat cheeses. Nonfat, low-sodium ricotta or cottage cheese. Low-fat or nonfat yogurt. Low-fat, low-sodium cheese. Fats and oils Soft margarine without trans fats. Vegetable oil. Low-fat, reduced-fat, or light mayonnaise and salad dressings (reduced-sodium). Canola, safflower, olive, soybean, and sunflower oils. Avocado. Seasoning and other foods Herbs. Spices. Seasoning mixes without salt. Unsalted popcorn and pretzels. Fat-free sweets. What foods are not recommended? The items listed may not be a complete list. Talk with your dietitian about what dietary choices are best for you. Grains Baked goods made with fat, such as croissants, muffins, or some breads. Dry pasta or rice meal packs. Vegetables Creamed or fried vegetables. Vegetables in a cheese sauce. Regular canned vegetables (not low-sodium or reduced-sodium). Regular canned tomato sauce and paste (not low-sodium or reduced-sodium). Regular tomato and vegetable juice (not low-sodium or reduced-sodium). Angie Fava. Olives. Fruits Canned fruit in a light or heavy syrup. Fried fruit. Fruit in cream or butter sauce. Meat and other protein foods Fatty cuts of meat. Ribs. Fried meat. Berniece Salines. Sausage. Bologna and other processed lunch meats. Salami. Fatback. Hotdogs. Bratwurst. Salted nuts and seeds. Canned beans with added salt. Canned or smoked fish. Whole eggs or egg yolks. Chicken or [place] with skin. Dairy Whole or 2% milk, cream, and half-and-half. Whole or full-fat cream cheese. Whole-fat or sweetened yogurt.  Full-fat cheese. Nondairy creamers. Whipped toppings. Processed cheese and cheese spreads. Fats and oils Butter. Stick margarine. Lard. Shortening. Ghee. Bacon fat. Tropical oils, such as coconut, palm kernel, or palm oil. Seasoning and other foods Salted popcorn and pretzels. Onion salt, garlic salt, seasoned salt, table salt, and sea salt. Worcestershire sauce. Tartar sauce. Barbecue sauce. Teriyaki sauce. Soy sauce, including reduced-sodium. Steak sauce. Canned and packaged gravies. Fish sauce. Oyster sauce. Cocktail sauce. Horseradish that you find on the shelf. Ketchup. Mustard. Meat flavorings and tenderizers. Bouillon cubes. Hot sauce and Tabasco sauce. Premade or packaged marinades. Premade or packaged taco seasonings. Relishes. Regular salad dressings. Where to find more information:  National Heart, Lung, and [place]: https://wilson-eaton.com/  American Heart Association: www.heart.org Summary  The DASH eating plan is a healthy eating plan that has been shown to reduce high blood pressure (hypertension). It may also reduce your risk for type 2 diabetes, heart disease, and stroke.  With the DASH eating plan, you should limit salt (sodium) intake to 2,300 mg a day. If you have hypertension, you may need to reduce your sodium intake to 1,500 mg a day.  When on the DASH eating plan, aim to eat more fresh fruits and vegetables, whole grains, lean proteins, low-fat dairy, and heart-healthy fats.  Work with your health care provider or diet and nutrition  specialist (dietitian) to adjust your eating plan to your individual calorie needs. This information is not intended to replace advice given to you by your health care provider. Make sure you discuss any questions you have with your health care provider. Document Released: 12/09/2010 Document Revised: 12/02/2016 Document Reviewed: 12/14/2015 Elsevier Patient Education  [place].  Recombinant Zoster (Shingles) Vaccine: What You  Need to Know 1. Why get vaccinated? Recombinant zoster (shingles) vaccine can prevent shingles. Shingles (also called herpes zoster, or just zoster) is a painful skin rash, usually with blisters. In addition to the rash, shingles can cause fever, headache, chills, or upset stomach. More rarely, shingles can lead to pneumonia, hearing problems, blindness, brain inflammation (encephalitis), or death. The most common complication of shingles is long-term nerve pain called postherpetic neuralgia (PHN). PHN occurs in the areas where the shingles rash was, even after the rash clears up. It can last for months or years after the rash goes away. The pain from PHN can be severe and debilitating. About 10 to 18% of people who get shingles will experience PHN. The risk of PHN increases with age. An older adult with shingles is more likely to develop PHN and have longer lasting and more severe pain than a younger person with shingles. Shingles is caused by the varicella zoster virus, the same virus that causes chickenpox. After you have chickenpox, the virus stays in your body and can cause shingles later in life. Shingles cannot be passed from one person to another, but the virus that causes shingles can spread and cause chickenpox in someone who had never had chickenpox or received chickenpox vaccine. 2. Recombinant shingles vaccine Recombinant shingles vaccine provides strong protection against shingles. By preventing shingles, recombinant shingles vaccine also protects against PHN. Recombinant shingles vaccine is the preferred vaccine for the prevention of shingles. However, a different vaccine, live shingles vaccine, may be used in some circumstances. The recombinant shingles vaccine is recommended for adults 50 years and older without serious immune problems. It is given as a two-dose series. This vaccine is also recommended for people who have already gotten another type of shingles vaccine, the live shingles  vaccine. There is no live virus in this vaccine. Shingles vaccine may be given at the same time as other vaccines. 3. Talk with your health care provider Tell your vaccine provider if the person getting the vaccine:  Has had an allergic reaction after a previous dose of recombinant shingles vaccine, or has any severe, life-threatening allergies.  Is pregnant or breastfeeding.  Is currently experiencing an episode of shingles. In some cases, your health care provider may decide to postpone shingles vaccination to a future visit. People with minor illnesses, such as a cold, may be vaccinated. People who are moderately or severely ill should usually wait until they recover before getting recombinant shingles vaccine. Your health care provider can give you more information. 4. Risks of a vaccine reaction  A sore arm with mild or moderate pain is very common after recombinant shingles vaccine, affecting about 80% of vaccinated people. Redness and swelling can also happen at the site of the injection.  Tiredness, muscle pain, headache, shivering, fever, stomach pain, and nausea happen after vaccination in more than half of people who receive recombinant shingles vaccine. In clinical trials, about 1 out of 6 people who got recombinant zoster vaccine experienced side effects that prevented them from doing regular activities. Symptoms usually went away on their own in 2 to 3 days. You should  still get the second dose of recombinant zoster vaccine even if you had one of these reactions after the first dose. People sometimes faint after medical procedures, including vaccination. Tell your provider if you feel dizzy or have vision changes or ringing in the ears. As with any medicine, there is a very remote chance of a vaccine causing a severe allergic reaction, other serious injury, or death. 5. What if there is a serious problem? An allergic reaction could occur after the vaccinated person leaves the  clinic. If you see signs of a severe allergic reaction (hives, swelling of the face and throat, difficulty breathing, a fast heartbeat, dizziness, or weakness), call 9-1-1 and get the person to the nearest hospital. For other signs that concern you, call your health care provider. Adverse reactions should be reported to the Vaccine Adverse Event Reporting System (VAERS). Your health care provider will usually file this report, or you can do it yourself. Visit the VAERS website at www.vaers.LAgents.no or call 818-439-9049. VAERS is only for reporting reactions, and VAERS staff do not give medical advice. 6. How can I learn more?  Ask your health care provider.  Call your local or state health department.  Contact the Centers for Disease Control and Prevention (CDC): ? Call (740)286-1026 (1-800-CDC-INFO) or ? Visit CDC's website at PicCapture.uy Vaccine Information Statement Recombinant Zoster Vaccine (11/01/2017) This information is not intended to replace advice given to you by your health care provider. Make sure you discuss any questions you have with your health care provider. Document Released: 03/01/2016 Document Revised: 04/10/2018 Document Reviewed: 07/26/2017 Elsevier Patient Education  2020 [employer].

## 2018-10-22 NOTE — Assessment & Plan Note (Signed)
Chronic, stable.  Continue Benazepril for kidney protection and adjust as needed.  CMP today.

## 2018-10-23 ENCOUNTER — Other Ambulatory Visit: Payer: Self-pay | Admitting: Nurse Practitioner

## 2018-10-23 DIAGNOSIS — E875 Hyperkalemia: Secondary | ICD-10-CM

## 2018-10-23 LAB — CBC WITH DIFFERENTIAL/PLATELET
Basophils Absolute: 0 10*3/uL (ref 0.0–0.2)
Basos: 1 %
EOS (ABSOLUTE): 0.1 10*3/uL (ref 0.0–0.4)
Eos: 2 %
Hematocrit: 44.4 % (ref 37.5–51.0)
Hemoglobin: 14.7 g/dL (ref 13.0–17.7)
Immature Grans (Abs): 0 10*3/uL (ref 0.0–0.1)
Immature Granulocytes: 0 %
Lymphocytes Absolute: 1.3 10*3/uL (ref 0.7–3.1)
Lymphs: 25 %
MCH: 31.1 pg (ref 26.6–33.0)
MCHC: 33.1 g/dL (ref 31.5–35.7)
MCV: 94 fL (ref 79–97)
Monocytes Absolute: 0.5 10*3/uL (ref 0.1–0.9)
Monocytes: 9 %
Neutrophils Absolute: 3.2 10*3/uL (ref 1.4–7.0)
Neutrophils: 63 %
Platelets: 239 10*3/uL (ref 150–450)
RBC: 4.72 x10E6/uL (ref 4.14–5.80)
RDW: 12.8 % (ref 11.6–15.4)
WBC: 5 10*3/uL (ref 3.4–10.8)

## 2018-10-23 LAB — COMPREHENSIVE METABOLIC PANEL
ALT: 16 IU/L (ref 0–44)
AST: 17 IU/L (ref 0–40)
Albumin/Globulin Ratio: 1.8 (ref 1.2–2.2)
Albumin: 4.6 g/dL (ref 3.8–4.8)
Alkaline Phosphatase: 56 IU/L (ref 39–117)
BUN/Creatinine Ratio: 12 (ref 10–24)
BUN: 13 mg/dL (ref 8–27)
Bilirubin Total: 0.5 mg/dL (ref 0.0–1.2)
CO2: 24 mmol/L (ref 20–29)
Calcium: 9.5 mg/dL (ref 8.6–10.2)
Chloride: 105 mmol/L (ref 96–106)
Creatinine, Ser: 1.08 mg/dL (ref 0.76–1.27)
GFR calc Af Amer: 83 mL/min/{1.73_m2} (ref >59)
GFR calc non Af Amer: 72 mL/min/{1.73_m2} (ref >59)
Globulin, Total: 2.5 g/dL (ref 1.5–4.5)
Glucose: 115 mg/dL — ABNORMAL HIGH (ref 65–99)
Potassium: 5.6 mmol/L — ABNORMAL HIGH (ref 3.5–5.2)
Sodium: 143 mmol/L (ref 134–144)
Total Protein: 7.1 g/dL (ref 6.0–8.5)

## 2018-10-23 LAB — PSA: Prostate Specific Ag, Serum: 1.1 ng/mL (ref 0.0–4.0)

## 2018-10-23 LAB — LIPID PANEL W/O CHOL/HDL RATIO
Cholesterol, Total: 210 mg/dL — ABNORMAL HIGH (ref 100–199)
HDL: 52 mg/dL (ref >39)
LDL Chol Calc (NIH): 144 mg/dL — ABNORMAL HIGH (ref 0–99)
Triglycerides: 79 mg/dL (ref 0–149)
VLDL Cholesterol Cal: 14 mg/dL (ref 5–40)

## 2018-10-23 LAB — HEMOGLOBIN A1C
Est. average glucose Bld gHb Est-mCnc: 114 mg/dL
Hgb A1c MFr Bld: 5.6 % (ref 4.8–5.6)

## 2018-10-23 LAB — TSH: TSH: 2.61 u[IU]/mL (ref 0.450–4.500)

## 2018-10-23 NOTE — Progress Notes (Signed)
Potassium recheck. 

## 2018-10-30 ENCOUNTER — Encounter: Payer: BLUE CROSS/BLUE SHIELD | Admitting: Nurse Practitioner

## 2018-11-27 ENCOUNTER — Ambulatory Visit
Admission: RE | Admit: 2018-11-27 | Discharge: 2018-11-27 | Disposition: A | Discharge status: Home / Self Care | Payer: BC Managed Care – PPO | Source: Ambulatory Visit | Attending: Nurse Practitioner | Admitting: Nurse Practitioner

## 2018-11-27 ENCOUNTER — Other Ambulatory Visit: Payer: Self-pay

## 2018-11-27 DIAGNOSIS — M5441 Lumbago with sciatica, right side: Secondary | ICD-10-CM | POA: Diagnosis present

## 2018-11-27 DIAGNOSIS — G8929 Other chronic pain: Secondary | ICD-10-CM

## 2018-12-06 ENCOUNTER — Other Ambulatory Visit: Payer: Self-pay

## 2018-12-06 DIAGNOSIS — Z20822 Contact with and (suspected) exposure to covid-19: Secondary | ICD-10-CM

## 2018-12-09 LAB — NOVEL CORONAVIRUS, NAA: SARS-CoV-2, NAA: DETECTED — AB

## 2018-12-10 ENCOUNTER — Telehealth: Payer: Self-pay | Admitting: Nurse Practitioner

## 2018-12-10 NOTE — Telephone Encounter (Signed)
Called to Discuss with patient about Covid symptoms and the use of bamlanivimab, a monoclonal antibody infusion for those with mild to moderate Covid symptoms and at a high risk of hospitalization.     Pt is qualified for this infusion at the [facility] infusion center due to co-morbid conditions and/or a member of an at-risk group.    Patient is currently being managed for the following: Patient Active Problem List   Diagnosis Date Noted  . Chronic right-sided low back pain with right-sided sciatica 10/22/2018  . Chronic kidney disease, stage 3 04/07/2017  . GERD (gastroesophageal reflux disease) 03/03/2017  . Hypertensive heart/kidney disease without HF and with CKD stage III 07/01/2014  . Hyperlipidemia 07/01/2014  . Erectile dysfunction 07/01/2014  . Impaired fasting blood sugar 07/01/2014   Patient states that overall he is doing well and declines treatment at this time.

## 2019-04-22 ENCOUNTER — Ambulatory Visit (INDEPENDENT_AMBULATORY_CARE_PROVIDER_SITE_OTHER): Payer: BC Managed Care – PPO | Admitting: Nurse Practitioner

## 2019-04-22 ENCOUNTER — Encounter: Payer: Self-pay | Admitting: Nurse Practitioner

## 2019-04-22 ENCOUNTER — Other Ambulatory Visit: Payer: Self-pay

## 2019-04-22 VITALS — BP 128/80 | HR 78 | Temp 97.7°F | Wt 194.4 lb

## 2019-04-22 DIAGNOSIS — I1310 Hypertensive heart and chronic kidney disease without heart failure, with stage 1 through stage 4 chronic kidney disease, or unspecified chronic kidney disease: Secondary | ICD-10-CM | POA: Diagnosis not present

## 2019-04-22 DIAGNOSIS — K219 Gastro-esophageal reflux disease without esophagitis: Secondary | ICD-10-CM

## 2019-04-22 DIAGNOSIS — N1831 Chronic kidney disease, stage 3a: Secondary | ICD-10-CM

## 2019-04-22 DIAGNOSIS — E7800 Pure hypercholesterolemia, unspecified: Secondary | ICD-10-CM

## 2019-04-22 LAB — MICROALBUMIN, URINE WAIVED
Creatinine, Urine Waived: 100 mg/dL (ref 10–300)
Microalb, Ur Waived: 30 mg/L — ABNORMAL HIGH (ref 0–19)
Microalb/Creat Ratio: <30 mg/g (ref <30)

## 2019-04-22 NOTE — Assessment & Plan Note (Signed)
Chronic, ongoing.  Continue current medication regimen and adjust as needed.  Lipid panel today.  Non fasting.

## 2019-04-22 NOTE — Patient Instructions (Signed)
DASH Eating Plan DASH stands for "Dietary Approaches to Stop Hypertension." The DASH eating plan is a healthy eating plan that has been shown to reduce high blood pressure (hypertension). It may also reduce your risk for type 2 diabetes, heart disease, and stroke. The DASH eating plan may also help with weight loss. What are tips for following this plan?  General guidelines  Avoid eating more than 2,300 mg (milligrams) of salt (sodium) a day. If you have hypertension, you may need to reduce your sodium intake to 1,500 mg a day.  Limit alcohol intake to no more than 1 drink a day for nonpregnant women and 2 drinks a day for men. One drink equals 12 oz of beer, 5 oz of wine, or 1 oz of hard liquor.  Work with your health care provider to maintain a healthy body weight or to lose weight. Ask what an ideal weight is for you.  Get at least 30 minutes of exercise that causes your heart to beat faster (aerobic exercise) most days of the week. Activities may include walking, swimming, or biking.  Work with your health care provider or diet and nutrition specialist (dietitian) to adjust your eating plan to your individual calorie needs. Reading food labels   Check food labels for the amount of sodium per serving. Choose foods with less than 5 percent of the Daily Value of sodium. Generally, foods with less than 300 mg of sodium per serving fit into this eating plan.  To find whole grains, look for the word "whole" as the first word in the ingredient list. Shopping  Buy products labeled as "low-sodium" or "no salt added."  Buy fresh foods. Avoid canned foods and premade or frozen meals. Cooking  Avoid adding salt when cooking. Use salt-free seasonings or herbs instead of table salt or sea salt. Check with your health care provider or pharmacist before using salt substitutes.  Do not fry foods. Cook foods using healthy methods such as baking, boiling, grilling, and broiling instead.  Cook with  heart-healthy oils, such as olive, canola, soybean, or sunflower oil. Meal planning  Eat a balanced diet that includes: ? 5 or more servings of fruits and vegetables each day. At each meal, try to fill half of your plate with fruits and vegetables. ? Up to 6-8 servings of whole grains each day. ? Less than 6 oz of lean meat, poultry, or fish each day. A 3-oz serving of meat is about the same size as a deck of cards. One egg equals 1 oz. ? 2 servings of low-fat dairy each day. ? A serving of nuts, seeds, or beans 5 times each week. ? Heart-healthy fats. Healthy fats called Omega-3 fatty acids are found in foods such as flaxseeds and coldwater fish, like sardines, salmon, and mackerel.  Limit how much you eat of the following: ? Canned or prepackaged foods. ? Food that is high in trans fat, such as fried foods. ? Food that is high in saturated fat, such as fatty meat. ? Sweets, desserts, sugary drinks, and other foods with added sugar. ? Full-fat dairy products.  Do not salt foods before eating.  Try to eat at least 2 vegetarian meals each week.  Eat more home-cooked food and less restaurant, buffet, and fast food.  When eating at a restaurant, ask that your food be prepared with less salt or no salt, if possible. What foods are recommended? The items listed may not be a complete list. Talk with your dietitian about   what dietary choices are best for you. Grains Whole-grain or whole-wheat bread. Whole-grain or whole-wheat pasta. Brown rice. Oatmeal. Quinoa. Bulgur. Whole-grain and low-sodium cereals. Pita bread. Low-fat, low-sodium crackers. Whole-wheat flour tortillas. Vegetables Fresh or frozen vegetables (raw, steamed, roasted, or grilled). Low-sodium or reduced-sodium tomato and vegetable juice. Low-sodium or reduced-sodium tomato sauce and tomato paste. Low-sodium or reduced-sodium canned vegetables. Fruits All fresh, dried, or frozen fruit. Canned fruit in natural juice (without  added sugar). Meat and other protein foods Skinless chicken or turkey. Ground chicken or turkey. Pork with fat trimmed off. Fish and seafood. Egg whites. Dried beans, peas, or lentils. Unsalted nuts, nut butters, and seeds. Unsalted canned beans. Lean cuts of beef with fat trimmed off. Low-sodium, lean deli meat. Dairy Low-fat (1%) or fat-free (skim) milk. Fat-free, low-fat, or reduced-fat cheeses. Nonfat, low-sodium ricotta or cottage cheese. Low-fat or nonfat yogurt. Low-fat, low-sodium cheese. Fats and oils Soft margarine without trans fats. Vegetable oil. Low-fat, reduced-fat, or light mayonnaise and salad dressings (reduced-sodium). Canola, safflower, olive, soybean, and sunflower oils. Avocado. Seasoning and other foods Herbs. Spices. Seasoning mixes without salt. Unsalted popcorn and pretzels. Fat-free sweets. What foods are not recommended? The items listed may not be a complete list. Talk with your dietitian about what dietary choices are best for you. Grains Baked goods made with fat, such as croissants, muffins, or some breads. Dry pasta or rice meal packs. Vegetables Creamed or fried vegetables. Vegetables in a cheese sauce. Regular canned vegetables (not low-sodium or reduced-sodium). Regular canned tomato sauce and paste (not low-sodium or reduced-sodium). Regular tomato and vegetable juice (not low-sodium or reduced-sodium). Pickles. Olives. Fruits Canned fruit in a light or heavy syrup. Fried fruit. Fruit in cream or butter sauce. Meat and other protein foods Fatty cuts of meat. Ribs. Fried meat. Bacon. Sausage. Bologna and other processed lunch meats. Salami. Fatback. Hotdogs. Bratwurst. Salted nuts and seeds. Canned beans with added salt. Canned or smoked fish. Whole eggs or egg yolks. Chicken or turkey with skin. Dairy Whole or 2% milk, cream, and half-and-half. Whole or full-fat cream cheese. Whole-fat or sweetened yogurt. Full-fat cheese. Nondairy creamers. Whipped toppings.  Processed cheese and cheese spreads. Fats and oils Butter. Stick margarine. Lard. Shortening. Ghee. Bacon fat. Tropical oils, such as coconut, palm kernel, or palm oil. Seasoning and other foods Salted popcorn and pretzels. Onion salt, garlic salt, seasoned salt, table salt, and sea salt. Worcestershire sauce. Tartar sauce. Barbecue sauce. Teriyaki sauce. Soy sauce, including reduced-sodium. Steak sauce. Canned and packaged gravies. Fish sauce. Oyster sauce. Cocktail sauce. Horseradish that you find on the shelf. Ketchup. Mustard. Meat flavorings and tenderizers. Bouillon cubes. Hot sauce and Tabasco sauce. Premade or packaged marinades. Premade or packaged taco seasonings. Relishes. Regular salad dressings. Where to find more information:  National Heart, Lung, and Blood Institute: www.nhlbi.nih.gov  American Heart Association: www.heart.org Summary  The DASH eating plan is a healthy eating plan that has been shown to reduce high blood pressure (hypertension). It may also reduce your risk for type 2 diabetes, heart disease, and stroke.  With the DASH eating plan, you should limit salt (sodium) intake to 2,300 mg a day. If you have hypertension, you may need to reduce your sodium intake to 1,500 mg a day.  When on the DASH eating plan, aim to eat more fresh fruits and vegetables, whole grains, lean proteins, low-fat dairy, and heart-healthy fats.  Work with your health care provider or diet and nutrition specialist (dietitian) to adjust your eating plan to your   individual calorie needs. This information is not intended to replace advice given to you by your health care provider. Make sure you discuss any questions you have with your health care provider. Document Revised: 12/02/2016 Document Reviewed: 12/14/2015 Elsevier Patient Education  2020 [employer]  

## 2019-04-22 NOTE — Progress Notes (Signed)
BP 128/80 (BP Location: Left Arm)   Pulse 78   Temp 97.7 F (36.5 C) (Oral)   Wt 194 lb 6 oz (88.2 kg)   SpO2 96%   BMI 29.55 kg/m    Subjective:    Patient ID: Brent Smith, male    DOB: [birth year], 64 y.o.   MRN: [id number]  HPI: Brent Smith is a 64 y.o. male  Chief Complaint  Patient presents with  . Hypertension  . Hyperlipidemia   HYPERTENSION / HYPERLIPIDEMIA Continues on Benazepril 40 MG and Lipitor 40 MG.  Has received both Covid vaccines. Satisfied with current treatment? yes Duration of hypertension: chronic BP monitoring frequency: occasionally BP range: 120/80 range BP medication side effects: no Duration of hyperlipidemia: chronic Cholesterol medication side effects: no Cholesterol supplements: none Medication compliance: good compliance Aspirin: yes Recent stressors: no Recurrent headaches: no Visual changes: no Palpitations: no Dyspnea: no Chest pain: no Lower extremity edema: no Dizzy/lightheaded: no   CHRONIC KIDNEY DISEASE Is on Benazepril.  October labs showed CRT 1.08 and GFR 72, K+ 5.6. CKD status: stable Medications renally dose: yes Previous renal evaluation: no Pneumovax:  not applicable Influenza Vaccine:  yes  GERD Continues on Prilosec daily.  Well-controlled. Mag level in April 2020 -- 2.1. GERD control status: stable  Satisfied with current treatment? yes Dysphagia: no Odynophagia:  no Hematemesis: no Blood in stool: no EGD: no   Relevant past medical, surgical, family and social history reviewed and updated as indicated. Interim medical history since our last visit reviewed. Allergies and medications reviewed and updated.  Review of Systems  Constitutional: Negative for activity change, diaphoresis, fatigue and fever.  Respiratory: Negative for cough, chest tightness, shortness of breath and wheezing.   Cardiovascular: Negative for chest pain, palpitations and leg swelling.  Gastrointestinal: Negative.     Neurological: Negative.   Psychiatric/Behavioral: Negative.     Per HPI unless specifically indicated above     Objective:    BP 128/80 (BP Location: Left Arm)   Pulse 78   Temp 97.7 F (36.5 C) (Oral)   Wt 194 lb 6 oz (88.2 kg)   SpO2 96%   BMI 29.55 kg/m   Wt Readings from Last 3 Encounters:  04/22/19 194 lb 6 oz (88.2 kg)  10/22/18 191 lb 6.4 oz (86.8 kg)  04/27/18 195 lb (88.5 kg)    Physical Exam Vitals and nursing note reviewed.  Constitutional:      General: He is awake. He is not in acute distress.    Appearance: He is well-developed and well-groomed. He is not ill-appearing.  HENT:     Head: Normocephalic and atraumatic.     Right Ear: Hearing normal. No drainage.     Left Ear: Hearing normal. No drainage.  Eyes:     General: Lids are normal.        Right eye: No discharge.        Left eye: No discharge.     Conjunctiva/sclera: Conjunctivae normal.     Pupils: Pupils are equal, round, and reactive to light.  Neck:     Vascular: No carotid bruit.  Cardiovascular:     Rate and Rhythm: Normal rate and regular rhythm.     Heart sounds: Normal heart sounds, S1 normal and S2 normal. No murmur. No gallop.   Pulmonary:     Effort: Pulmonary effort is normal. No accessory muscle usage or respiratory distress.     Breath sounds: Normal breath sounds.  Abdominal:  General: Bowel sounds are normal.     Palpations: Abdomen is soft.  Musculoskeletal:        General: Normal range of motion.     Cervical back: Normal range of motion and neck supple.     Right lower leg: No edema.     Left lower leg: No edema.  Skin:    General: Skin is warm and dry.     Capillary Refill: Capillary refill takes less than 2 seconds.  Neurological:     Mental Status: He is alert and oriented to person, place, and time.  Psychiatric:        Attention and Perception: Attention normal.        Mood and Affect: Mood normal.        Speech: Speech normal.        Behavior: Behavior  normal. Behavior is cooperative.        Thought Content: Thought content normal.     Results for orders placed or performed in visit on 12/06/18  Novel Coronavirus, NAA ([facility])   Specimen: Nasopharyngeal(NP) swabs in vial transport medium   NASOPHARYNGE  TESTING  Result Value Ref Range   SARS-CoV-2, NAA Detected (A) Not Detected      Assessment & Plan:   Problem List Items Addressed This Visit      Cardiovascular and Mediastinum   Hypertensive heart/kidney disease without HF and with CKD stage III - Primary    Chronic, ongoing.  Initial BP elevated, but repeat at goal and home readings at goal. Continue current medication regimen and adjust as needed based on readings.  Continue checking BP three mornings a week and documenting for provider review. Labs today.  Return in 6 months.      Relevant Orders   Basic metabolic panel   Microalbumin, Urine Waived     Digestive   GERD (gastroesophageal reflux disease)    Chronic, stable.  Continue current medication regimen and check Mag level today.      Relevant Orders   Magnesium     Genitourinary   Chronic kidney disease, stage 3    Chronic, stable.  Continue Benazepril for kidney protection and adjust as needed.  BMP today + urine micro.      Relevant Orders   Basic metabolic panel   Microalbumin, Urine Waived     Other   Hyperlipidemia    Chronic, ongoing.  Continue current medication regimen and adjust as needed.  Lipid panel today.  Non fasting.      Relevant Orders   Lipid Panel w/o Chol/HDL Ratio       Follow up plan: Return in about 6 months (around 10/22/2019) for Annual physical.

## 2019-04-22 NOTE — Assessment & Plan Note (Addendum)
Chronic, stable.  Continue Benazepril for kidney protection and adjust as needed.  BMP today + urine micro.

## 2019-04-22 NOTE — Assessment & Plan Note (Signed)
Chronic, stable.  Continue current medication regimen and check Mag level today.

## 2019-04-22 NOTE — Assessment & Plan Note (Signed)
Chronic, ongoing.  Initial BP elevated, but repeat at goal and home readings at goal. Continue current medication regimen and adjust as needed based on readings.  Continue checking BP three mornings a week and documenting for provider review. Labs today.  Return in 6 months.

## 2019-04-23 LAB — LIPID PANEL W/O CHOL/HDL RATIO
Cholesterol, Total: 160 mg/dL (ref 100–199)
HDL: 54 mg/dL (ref >39)
LDL Chol Calc (NIH): 94 mg/dL (ref 0–99)
Triglycerides: 57 mg/dL (ref 0–149)
VLDL Cholesterol Cal: 12 mg/dL (ref 5–40)

## 2019-04-23 LAB — BASIC METABOLIC PANEL
BUN/Creatinine Ratio: 15 (ref 10–24)
BUN: 16 mg/dL (ref 8–27)
CO2: 24 mmol/L (ref 20–29)
Calcium: 9.3 mg/dL (ref 8.6–10.2)
Chloride: 107 mmol/L — ABNORMAL HIGH (ref 96–106)
Creatinine, Ser: 1.05 mg/dL (ref 0.76–1.27)
GFR calc Af Amer: 86 mL/min/{1.73_m2} (ref >59)
GFR calc non Af Amer: 74 mL/min/{1.73_m2} (ref >59)
Glucose: 113 mg/dL — ABNORMAL HIGH (ref 65–99)
Potassium: 5 mmol/L (ref 3.5–5.2)
Sodium: 143 mmol/L (ref 134–144)

## 2019-04-23 LAB — MAGNESIUM: Magnesium: 2 mg/dL (ref 1.6–2.3)

## 2019-04-23 NOTE — Progress Notes (Signed)
Contacted via MyChart

## 2019-07-03 ENCOUNTER — Other Ambulatory Visit: Payer: Self-pay | Admitting: Nurse Practitioner

## 2019-07-03 NOTE — Telephone Encounter (Signed)
Requested Prescriptions  Pending Prescriptions Disp Refills  . omeprazole (PRILOSEC) 20 MG capsule [Pharmacy Med Name: OMEPRAZOLE DR 20 MG CAP] 90 capsule 3    Sig: TAKE 1 CAPSULE BY MOUTH ONCE DAILY     Gastroenterology: Proton Pump Inhibitors Passed - 07/03/2019  5:57 PM      Passed - Valid encounter within last 12 months    Recent Outpatient Visits          2 months ago 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   [facility] Cannady, Jolene T, NP   8 months ago 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   [facility] Cannady, Jolene T, NP   1 year ago Hypertensive heart/kidney disease without HF and with CKD stage III (HCC)   [facility] Cannady, Dorie Rank, NP   1 year ago Essential hypertension   [facility] Gabriel Cirri, NP   2 years ago Essential hypertension   [facility] Gabriel Cirri, NP      Future Appointments            In 3 months Cannady, Dorie Rank, NP [facility]           . atorvastatin (LIPITOR) 40 MG tablet [Pharmacy Med Name: ATORVASTATIN CALCIUM 40 MG TAB] 90 tablet 2    Sig: TAKE 1 TABLET BY MOUTH AT BEDTIME     Cardiovascular:  Antilipid - Statins Failed - 07/03/2019  5:57 PM      Failed - LDL in normal range and within 360 days    LDL Chol Calc (NIH)  Date Value Ref Range Status  04/22/2019 94 0 - 99 mg/dL Final         Passed - Total Cholesterol in normal range and within 360 days    Cholesterol, Total  Date Value Ref Range Status  04/22/2019 160 100 - 199 mg/dL Final   Cholesterol Piccolo, Waived  Date Value Ref Range Status  04/26/2018 212 (H) <200 mg/dL Final    Comment:                            Desirable                <200                         Borderline High      200- 239                         High                     >239           Passed - HDL in normal range and within 360 days    HDL  Date Value Ref Range Status  04/22/2019 54 >39 mg/dL Final         Passed - Triglycerides in normal range and within 360 days    Triglycerides  Date Value Ref Range Status  04/22/2019 57 0 - 149 mg/dL Final   Triglycerides Piccolo,Waived  Date Value Ref Range Status  04/26/2018 97 <150 mg/dL Final    Comment:  Normal                   <150                         Borderline High     150 - 199                         High                200 - 499                         Very High                >499          Passed - Patient is not pregnant      Passed - Valid encounter within last 12 months    Recent Outpatient Visits          2 months ago 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   [facility] Cannady, Jolene T, NP   8 months ago 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   [facility] Cannady, Jolene T, NP   1 year ago Hypertensive heart/kidney disease without HF and with CKD stage III (HCC)   [facility] Cannady, Dorie Rank, NP   1 year ago Essential hypertension   [facility] Gabriel Cirri, NP   2 years ago Essential hypertension   [facility] Gabriel Cirri, NP      Future Appointments            In 3 months Cannady, Dorie Rank, NP [facility]

## 2019-10-16 ENCOUNTER — Other Ambulatory Visit: Payer: Self-pay | Admitting: Unknown Physician Specialty

## 2019-10-16 DIAGNOSIS — T753XXD Motion sickness, subsequent encounter: Secondary | ICD-10-CM

## 2019-10-16 NOTE — Telephone Encounter (Signed)
Requested medication (s) are due for refill today - expired  Requested medication (s) are on the active medication list -yes  Future visit scheduled -yes  Last refill: 10/09/17  Notes to clinic: Request RF- medication not assigned to protocol  Requested Prescriptions  Pending Prescriptions Disp Refills   TRANSDERM-SCOP, 1.5 MG, 1 MG/3DAYS [Pharmacy Med Name: TRANSDERM-SCOP (1.5 MG) 1 MG/3DAYS] 10 patch     Sig: PLACE 1 PATCH ONTO THE SKIN EVERY 3(THREE) DAYS      Off-Protocol Failed - 10/16/2019  9:42 AM      Failed - Medication not assigned to a protocol, review manually.      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 III (HCC)   [facility] Cannady, Dorie Rank, NP   1 year ago Essential hypertension   [facility] Gabriel Cirri, NP   2 years ago Essential hypertension   [facility] Gabriel Cirri, NP       Future Appointments             In 1 week Cannady, Dorie Rank, NP [facility]                Requested Prescriptions  Pending Prescriptions Disp Refills   TRANSDERM-SCOP, 1.5 MG, 1 MG/3DAYS [Pharmacy Med Name: TRANSDERM-SCOP (1.5 MG) 1 MG/3DAYS] 10 patch     Sig: PLACE 1 PATCH ONTO THE SKIN EVERY 3(THREE) DAYS      Off-Protocol Failed - 10/16/2019  9:42 AM      Failed - Medication not assigned to a protocol, review manually.      Passed - Valid encounter within last 12 months    Recent Outpatient Visits           5 months ago Hypertensive heart and kidney  disease without heart failure and with stage 3a chronic kidney disease   [facility] Cannady, Jolene T, NP   11 months ago 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   [facility] Cannady, Jolene T, NP   1 year ago Hypertensive heart/kidney disease without HF and with CKD stage III (HCC)   [facility] Cannady, Dorie Rank, NP   1 year ago Essential hypertension   [facility] Gabriel Cirri, NP   2 years ago Essential hypertension   [facility] Gabriel Cirri, NP       Future Appointments             In 1 week Cannady, Dorie Rank, NP [facility]

## 2019-10-28 ENCOUNTER — Ambulatory Visit (INDEPENDENT_AMBULATORY_CARE_PROVIDER_SITE_OTHER): Payer: BC Managed Care – PPO | Admitting: Nurse Practitioner

## 2019-10-28 ENCOUNTER — Other Ambulatory Visit: Payer: Self-pay

## 2019-10-28 ENCOUNTER — Encounter: Payer: Self-pay | Admitting: Nurse Practitioner

## 2019-10-28 VITALS — BP 133/86 | HR 73 | Temp 97.8°F | Resp 16 | Ht 68.5 in | Wt 193.0 lb

## 2019-10-28 DIAGNOSIS — Z13220 Encounter for screening for lipoid disorders: Secondary | ICD-10-CM

## 2019-10-28 DIAGNOSIS — Z125 Encounter for screening for malignant neoplasm of prostate: Secondary | ICD-10-CM

## 2019-10-28 DIAGNOSIS — Z1329 Encounter for screening for other suspected endocrine disorder: Secondary | ICD-10-CM | POA: Diagnosis not present

## 2019-10-28 DIAGNOSIS — Z0000 Encounter for general adult medical examination without abnormal findings: Secondary | ICD-10-CM | POA: Diagnosis not present

## 2019-10-28 DIAGNOSIS — Z131 Encounter for screening for diabetes mellitus: Secondary | ICD-10-CM

## 2019-10-28 MED ORDER — OMEPRAZOLE 20 MG PO CPDR: 20.0000 mg | DELAYED_RELEASE_CAPSULE | Freq: Every day | ORAL | 4 refills | Status: DC

## 2019-10-28 MED ORDER — ATORVASTATIN CALCIUM 40 MG PO TABS: ORAL_TABLET | ORAL | 4 refills | Status: DC

## 2019-10-28 MED ORDER — BENAZEPRIL HCL 40 MG PO TABS: ORAL_TABLET | ORAL | 4 refills | Status: DC

## 2019-10-28 NOTE — Progress Notes (Signed)
BP 133/86 (BP Location: Left Arm, Patient Position: Sitting, Cuff Size: Normal)   Pulse 73   Temp 97.8 F (36.6 C) (Oral)   Resp 16   Ht 5' 8.5" (1.74 m)   Wt 193 lb (87.5 kg)   SpO2 98%   BMI 28.92 kg/m    Subjective:    Patient ID: Brent Smith, male    DOB: 08/24/1955, 64 y.o.   MRN: [id number]  HPI: Brent Smith is a 64 y.o. male presenting on 10/28/2019 for comprehensive medical examination. Current medical complaints include: back pain  He currently lives with: significant other Interim Problems from his last visit: no   Functional Status Survey: Is the patient deaf or have difficulty hearing?: No Does the patient have difficulty seeing, even when wearing glasses/contacts?: No Does the patient have difficulty concentrating, remembering, or making decisions?: No Does the patient have difficulty walking or climbing stairs?: No Does the patient have difficulty dressing or bathing?: No Does the patient have difficulty doing errands alone such as visiting a doctor's office or shopping?: No  FALL RISK: Fall Risk  10/28/2019 10/22/2018 03/03/2017 10/26/2015  Falls in the past year? 0 0 No Yes  Number falls in past yr: 0 0 - 1  Injury with Fall? 0 0 - No  Risk for fall due to : No Fall Risks - - -  Follow up Falls evaluation completed Falls evaluation completed - -    Dep[facility] 10/28/2019 10/28/2019 10/22/2018 03/03/2017 10/26/2015  Decreased Interest 0 0 0 0 0  Down, Depressed, Hopeless 0 0 0 0 0  PHQ - 2 Score 0 0 0 0 0  Altered sleeping - - - - 0  Tired, decreased energy - - - - 0  Change in appetite - - - - 0  Feeling bad or failure about yourself  - - - - 0  Trouble concentrating - - - - 0  Moving slowly or fidgety/restless - - - - 0  Suicidal thoughts - - - - 0  PHQ-9 Score - - - - 0    Advanced Directives <no information>  Past Medical History:  Past Medical History:  Diagnosis Date  . GERD (gastroesophageal  reflux disease)   . Hyperlipidemia   . Hypertension     Surgical History:  Past Surgical History:  Procedure Laterality Date  . SHOULDER SURGERY  2009  . TONSILLECTOMY      Medications:  Current Outpatient Medications on File Prior to Visit  Medication Sig  . aspirin 81 MG tablet Take 81 mg by mouth daily.  . TRANSDERM-SCOP, 1.5 MG, 1 MG/3DAYS PLACE 1 PATCH ONTO THE SKIN EVERY 3(THREE) DAYS   No current facility-administered medications on file prior to visit.    Allergies:  No Known Allergies  Social History:  Social History   Socioeconomic History  . Marital status: Married    Spouse name: Not on file  . Number of children: Not on file  . Years of education: Not on file  . Highest education level: Not on file  Occupational History  . Not on file  Tobacco Use  . Smoking status: Former Games developermoker  . Smokeless tobacco: Never Used  Vaping Use  . Vaping Use: Never used  Substance and Sexual Activity  . Alcohol use: Yes    Comment: pt states on occasion or on vacation  . Drug use: No  . Sexual activity: Yes  Other Topics Concern  . Not on  file  Social History Narrative  . Not on file   Social Determinants of Health   Financial Resource Strain:   . Difficulty of Paying Living Expenses: Not on file  Food Insecurity:   . Worried About Programme researcher, broadcasting/film/video in the Last Year: Not on file  . Ran Out of Food in the Last Year: Not on file  Transportation Needs:   . Lack of Transportation (Medical): Not on file  . Lack of Transportation (Non-Medical): Not on file  Physical Activity:   . Days of Exercise per Week: Not on file  . Minutes of Exercise per Session: Not on file  Stress:   . Feeling of Stress : Not on file  Social Connections:   . Frequency of Communication with Friends and Family: Not on file  . Frequency of Social Gatherings with Friends and Family: Not on file  . Attends Religious Services: Not on file  . Active Member of Clubs or Organizations: Not on  file  . Attends Banker Meetings: Not on file  . Marital Status: Not on file  Intimate Partner Violence:   . Fear of Current or Ex-Partner: Not on file  . Emotionally Abused: Not on file  . Physically Abused: Not on file  . Sexually Abused: Not on file   Social History   Tobacco Use  Smoking Status Former Smoker  Smokeless Tobacco Never Used   Social History   Substance and Sexual Activity  Alcohol Use Yes   Comment: pt states on occasion or on vacation    Family History:  Family History  Problem Relation Age of Onset  . Hypertension Mother   . Parkinson's disease Mother   . Hypertension Father   . Dementia Father   . Hypertension Sister   . Hypertension Brother   . Hypertension Maternal Grandfather     Past medical history, surgical history, medications, allergies, family history and social history reviewed with patient today and changes made to appropriate areas of the chart.   Review of Systems -  Back pain All other ROS negative except what is listed above and in the HPI.      Objective:    BP 133/86 (BP Location: Left Arm, Patient Position: Sitting, Cuff Size: Normal)   Pulse 73   Temp 97.8 F (36.6 C) (Oral)   Resp 16   Ht 5' 8.5" (1.74 m)   Wt 193 lb (87.5 kg)   SpO2 98%   BMI 28.92 kg/m   Wt Readings from Last 3 Encounters:  10/28/19 193 lb (87.5 kg)  04/22/19 194 lb 6 oz (88.2 kg)  10/22/18 191 lb 6.4 oz (86.8 kg)    Physical Exam Vitals and nursing note reviewed.  Constitutional:      General: He is awake. He is not in acute distress.    Appearance: He is well-developed and overweight. He is not ill-appearing.  HENT:     Head: Normocephalic and atraumatic.     Right Ear: Hearing, tympanic membrane, ear canal and external ear normal. No drainage.     Left Ear: Hearing, tympanic membrane, ear canal and external ear normal. No drainage.     Nose: Nose normal.     Mouth/Throat:     Mouth: Mucous membranes are moist.     Pharynx:  Uvula midline.  Eyes:     General: Lids are normal.        Right eye: No discharge.        Left eye:  No discharge.     Extraocular Movements: Extraocular movements intact.     Conjunctiva/sclera: Conjunctivae normal.     Pupils: Pupils are equal, round, and reactive to light.     Visual Fields: Right eye visual fields normal and left eye visual fields normal.  Neck:     Thyroid: No thyromegaly.     Vascular: No carotid bruit or JVD.  Cardiovascular:     Rate and Rhythm: Normal rate and regular rhythm.     Heart sounds: Normal heart sounds, S1 normal and S2 normal. No murmur heard.  No gallop.   Pulmonary:     Effort: Pulmonary effort is normal. No accessory muscle usage or respiratory distress.     Breath sounds: Normal breath sounds.  Abdominal:     General: Bowel sounds are normal.     Palpations: Abdomen is soft. There is no hepatomegaly or splenomegaly.     Tenderness: There is no abdominal tenderness.  Musculoskeletal:        General: Normal range of motion.     Cervical back: Normal range of motion and neck supple.     Right lower leg: No edema.     Left lower leg: No edema.  Lymphadenopathy:     Head:     Right side of head: No submental, submandibular, tonsillar, preauricular or posterior auricular adenopathy.     Left side of head: No submental, submandibular, tonsillar, preauricular or posterior auricular adenopathy.     Cervical: No cervical adenopathy.  Skin:    General: Skin is warm and dry.     Capillary Refill: Capillary refill takes less than 2 seconds.     Findings: No rash.  Neurological:     Mental Status: He is alert and oriented to person, place, and time.     Cranial Nerves: Cranial nerves are intact.     Gait: Gait is intact.     Deep Tendon Reflexes: Reflexes are normal and symmetric.     Reflex Scores:      Brachioradialis reflexes are 2+ on the right side and 2+ on the left side.      Patellar reflexes are 2+ on the right side and 2+ on the left  side. Psychiatric:        Attention and Perception: Attention normal.        Mood and Affect: Mood normal.        Speech: Speech normal.        Behavior: Behavior normal. Behavior is cooperative.        Thought Content: Thought content normal.        Cognition and Memory: Cognition normal.        Judgment: Judgment normal.    Results for orders placed or performed in visit on 04/22/19  Basic metabolic panel  Result Value Ref Range   Glucose 113 (H) 65 - 99 mg/dL   BUN 16 8 - 27 mg/dL   Creatinine, Ser 3.53 0.76 - 1.27 mg/dL   GFR calc non Af Amer 74 >59 mL/min/1.73   GFR calc Af Amer 86 >59 mL/min/1.73   BUN/Creatinine Ratio 15 10 - 24   Sodium 143 134 - 144 mmol/L   Potassium 5.0 3.5 - 5.2 mmol/L   Chloride 107 (H) 96 - 106 mmol/L   CO2 24 20 - 29 mmol/L   Calcium 9.3 8.6 - 10.2 mg/dL  Lipid Panel w/o Chol/HDL Ratio  Result Value Ref Range   Cholesterol, Total 160 100 - 199 mg/dL  Triglycerides 57 0 - 149 mg/dL   HDL 54 >51 mg/dL   VLDL Cholesterol Cal 12 5 - 40 mg/dL   LDL Chol Calc (NIH) 94 0 - 99 mg/dL  Microalbumin, Urine Waived  Result Value Ref Range   Microalb, Ur Waived 30 (H) 0 - 19 mg/L   Creatinine, Urine Waived 100 10 - 300 mg/dL   Microalb/Creat Ratio <30 <30 mg/g  Magnesium  Result Value Ref Range   Magnesium 2.0 1.6 - 2.3 mg/dL      Assessment & Plan:   Problem List Items Addressed This Visit    None    Visit Diagnoses    Annual physical exam    -  Primary   Overall healthy male on exam today, no acute complaints.  Annual labs today to include CBC, CMP, TSH, A1C, Lipid, PSA.   Relevant Orders   CBC with Differential/Platelet   Comprehensive metabolic panel   Lipid Panel w/o Chol/HDL Ratio   TSH   PSA   Prostate cancer screening       PSA on labs today.   Relevant Orders   PSA   Thyroid disorder screen       TSH on labs today.   Relevant Orders   TSH   Screening cholesterol level       Lipid panel, fasting, on labs today.   Relevant  Orders   Lipid Panel w/o Chol/HDL Ratio   Diabetes mellitus screening       A1C on labs today.   Relevant Orders   HgB A1c      Discussed aspirin prophylaxis for myocardial infarction prevention and decision was it was not indicated  LABORATORY TESTING:  Health maintenance labs ordered today as discussed above.   The natural history of prostate cancer and ongoing controversy regarding screening and potential treatment outcomes of prostate cancer has been discussed with the patient. The meaning of a false positive PSA and a false negative PSA has been discussed. He indicates understanding of the limitations of this screening test and wishes to proceed with screening PSA testing.   IMMUNIZATIONS:   - Tdap: Tetanus vaccination status reviewed: last tetanus booster within 10 years. - Influenza: refused today - Pneumovax: Not applicable - Prevnar: Not applicable - Zostavax vaccine:  Wishes to think about this  SCREENING: - Colonoscopy: Up to date  Discussed with patient purpose of the colonoscopy is to detect colon cancer at curable precancerous or early stages   - AAA Screening: Not applicable  -Hearing Test: Not applicable  -Spirometry: Not applicable   PATIENT COUNSELING:    Sexuality: Discussed sexually transmitted diseases, partner selection, use of condoms, avoidance of unintended pregnancy  and contraceptive alternatives.   Advised to avoid cigarette smoking.  I discussed with the patient that most people either abstain from alcohol or drink within safe limits (<=14/week and <=4 drinks/occasion for males, <=7/weeks and <= 3 drinks/occasion for females) and that the risk for alcohol disorders and other health effects rises proportionally with the number of drinks per week and how often a drinker exceeds daily limits.  Discussed cessation/primary prevention of drug use and availability of treatment for abuse.   Diet: Encouraged to adjust caloric intake to maintain  or achieve  ideal body weight, to reduce intake of dietary saturated fat and total fat, to limit sodium intake by avoiding high sodium foods and not adding table salt, and to maintain adequate dietary potassium and calcium preferably from fresh fruits, vegetables, and low-fat dairy products.  Stressed the importance of regular exercise  Injury prevention: Discussed safety belts, safety helmets, smoke detector, smoking near bedding or upholstery.   Dental health: Discussed importance of regular tooth brushing, flossing, and dental visits.   Follow up plan: NEXT PREVENTATIVE PHYSICAL DUE IN 1 YEAR. Return in about 6 months (around 04/27/2020) for HTN/HLD, IFG, GERD.

## 2019-10-28 NOTE — Patient Instructions (Signed)
DASH Eating Plan DASH stands for "Dietary Approaches to Stop Hypertension." The DASH eating plan is a healthy eating plan that has been shown to reduce high blood pressure (hypertension). It may also reduce your risk for type 2 diabetes, heart disease, and stroke. The DASH eating plan may also help with weight loss. What are tips for following this plan?  General guidelines  Avoid eating more than 2,300 mg (milligrams) of salt (sodium) a day. If you have hypertension, you may need to reduce your sodium intake to 1,500 mg a day.  Limit alcohol intake to no more than 1 drink a day for nonpregnant women and 2 drinks a day for men. One drink equals 12 oz of beer, 5 oz of wine, or 1 oz of hard liquor.  Work with your health care provider to maintain a healthy body weight or to lose weight. Ask what an ideal weight is for you.  Get at least 30 minutes of exercise that causes your heart to beat faster (aerobic exercise) most days of the week. Activities may include walking, swimming, or biking.  Work with your health care provider or diet and nutrition specialist (dietitian) to adjust your eating plan to your individual calorie needs. Reading food labels   Check food labels for the amount of sodium per serving. Choose foods with less than 5 percent of the Daily Value of sodium. Generally, foods with less than 300 mg of sodium per serving fit into this eating plan.  To find whole grains, look for the word "whole" as the first word in the ingredient list. Shopping  Buy products labeled as "low-sodium" or "no salt added."  Buy fresh foods. Avoid canned foods and premade or frozen meals. Cooking  Avoid adding salt when cooking. Use salt-free seasonings or herbs instead of table salt or sea salt. Check with your health care provider or pharmacist before using salt substitutes.  Do not fry foods. Cook foods using healthy methods such as baking, boiling, grilling, and broiling instead.  Cook with  heart-healthy oils, such as olive, canola, soybean, or sunflower oil. Meal planning  Eat a balanced diet that includes: ? 5 or more servings of fruits and vegetables each day. At each meal, try to fill half of your plate with fruits and vegetables. ? Up to 6-8 servings of whole grains each day. ? Less than 6 oz of lean meat, poultry, or fish each day. A 3-oz serving of meat is about the same size as a deck of cards. One egg equals 1 oz. ? 2 servings of low-fat dairy each day. ? A serving of nuts, seeds, or beans 5 times each week. ? Heart-healthy fats. Healthy fats called Omega-3 fatty acids are found in foods such as flaxseeds and coldwater fish, like sardines, salmon, and mackerel.  Limit how much you eat of the following: ? Canned or prepackaged foods. ? Food that is high in trans fat, such as fried foods. ? Food that is high in saturated fat, such as fatty meat. ? Sweets, desserts, sugary drinks, and other foods with added sugar. ? Full-fat dairy products.  Do not salt foods before eating.  Try to eat at least 2 vegetarian meals each week.  Eat more home-cooked food and less restaurant, buffet, and fast food.  When eating at a restaurant, ask that your food be prepared with less salt or no salt, if possible. What foods are recommended? The items listed may not be a complete list. Talk with your dietitian about   what dietary choices are best for you. Grains Whole-grain or whole-wheat bread. Whole-grain or whole-wheat pasta. Brown rice. Oatmeal. Quinoa. Bulgur. Whole-grain and low-sodium cereals. Pita bread. Low-fat, low-sodium crackers. Whole-wheat flour tortillas. Vegetables Fresh or frozen vegetables (raw, steamed, roasted, or grilled). Low-sodium or reduced-sodium tomato and vegetable juice. Low-sodium or reduced-sodium tomato sauce and tomato paste. Low-sodium or reduced-sodium canned vegetables. Fruits All fresh, dried, or frozen fruit. Canned fruit in natural juice (without  added sugar). Meat and other protein foods Skinless chicken or turkey. Ground chicken or turkey. Pork with fat trimmed off. Fish and seafood. Egg whites. Dried beans, peas, or lentils. Unsalted nuts, nut butters, and seeds. Unsalted canned beans. Lean cuts of beef with fat trimmed off. Low-sodium, lean deli meat. Dairy Low-fat (1%) or fat-free (skim) milk. Fat-free, low-fat, or reduced-fat cheeses. Nonfat, low-sodium ricotta or cottage cheese. Low-fat or nonfat yogurt. Low-fat, low-sodium cheese. Fats and oils Soft margarine without trans fats. Vegetable oil. Low-fat, reduced-fat, or light mayonnaise and salad dressings (reduced-sodium). Canola, safflower, olive, soybean, and sunflower oils. Avocado. Seasoning and other foods Herbs. Spices. Seasoning mixes without salt. Unsalted popcorn and pretzels. Fat-free sweets. What foods are not recommended? The items listed may not be a complete list. Talk with your dietitian about what dietary choices are best for you. Grains Baked goods made with fat, such as croissants, muffins, or some breads. Dry pasta or rice meal packs. Vegetables Creamed or fried vegetables. Vegetables in a cheese sauce. Regular canned vegetables (not low-sodium or reduced-sodium). Regular canned tomato sauce and paste (not low-sodium or reduced-sodium). Regular tomato and vegetable juice (not low-sodium or reduced-sodium). Pickles. Olives. Fruits Canned fruit in a light or heavy syrup. Fried fruit. Fruit in cream or butter sauce. Meat and other protein foods Fatty cuts of meat. Ribs. Fried meat. Bacon. Sausage. Bologna and other processed lunch meats. Salami. Fatback. Hotdogs. Bratwurst. Salted nuts and seeds. Canned beans with added salt. Canned or smoked fish. Whole eggs or egg yolks. Chicken or turkey with skin. Dairy Whole or 2% milk, cream, and half-and-half. Whole or full-fat cream cheese. Whole-fat or sweetened yogurt. Full-fat cheese. Nondairy creamers. Whipped toppings.  Processed cheese and cheese spreads. Fats and oils Butter. Stick margarine. Lard. Shortening. Ghee. Bacon fat. Tropical oils, such as coconut, palm kernel, or palm oil. Seasoning and other foods Salted popcorn and pretzels. Onion salt, garlic salt, seasoned salt, table salt, and sea salt. Worcestershire sauce. Tartar sauce. Barbecue sauce. Teriyaki sauce. Soy sauce, including reduced-sodium. Steak sauce. Canned and packaged gravies. Fish sauce. Oyster sauce. Cocktail sauce. Horseradish that you find on the shelf. Ketchup. Mustard. Meat flavorings and tenderizers. Bouillon cubes. Hot sauce and Tabasco sauce. Premade or packaged marinades. Premade or packaged taco seasonings. Relishes. Regular salad dressings. Where to find more information:  National Heart, Lung, and Blood Institute: www.nhlbi.nih.gov  American Heart Association: www.heart.org Summary  The DASH eating plan is a healthy eating plan that has been shown to reduce high blood pressure (hypertension). It may also reduce your risk for type 2 diabetes, heart disease, and stroke.  With the DASH eating plan, you should limit salt (sodium) intake to 2,300 mg a day. If you have hypertension, you may need to reduce your sodium intake to 1,500 mg a day.  When on the DASH eating plan, aim to eat more fresh fruits and vegetables, whole grains, lean proteins, low-fat dairy, and heart-healthy fats.  Work with your health care provider or diet and nutrition specialist (dietitian) to adjust your eating plan to your   individual calorie needs. This information is not intended to replace advice given to you by your health care provider. Make sure you discuss any questions you have with your health care provider. Document Revised: 12/02/2016 Document Reviewed: 12/14/2015 Elsevier Patient Education  2020 [employer]  

## 2019-10-29 LAB — CBC WITH DIFFERENTIAL/PLATELET
Basophils Absolute: 0 10*3/uL (ref 0.0–0.2)
Basos: 1 %
EOS (ABSOLUTE): 0.1 10*3/uL (ref 0.0–0.4)
Eos: 3 %
Hematocrit: 43.3 % (ref 37.5–51.0)
Hemoglobin: 13.9 g/dL (ref 13.0–17.7)
Immature Grans (Abs): 0 10*3/uL (ref 0.0–0.1)
Immature Granulocytes: 0 %
Lymphocytes Absolute: 1.4 10*3/uL (ref 0.7–3.1)
Lymphs: 27 %
MCH: 30.4 pg (ref 26.6–33.0)
MCHC: 32.1 g/dL (ref 31.5–35.7)
MCV: 95 fL (ref 79–97)
Monocytes Absolute: 0.5 10*3/uL (ref 0.1–0.9)
Monocytes: 9 %
Neutrophils Absolute: 3.2 10*3/uL (ref 1.4–7.0)
Neutrophils: 60 %
Platelets: 219 10*3/uL (ref 150–450)
RBC: 4.57 x10E6/uL (ref 4.14–5.80)
RDW: 12.8 % (ref 11.6–15.4)
WBC: 5.3 10*3/uL (ref 3.4–10.8)

## 2019-10-29 LAB — COMPREHENSIVE METABOLIC PANEL
ALT: 16 IU/L (ref 0–44)
AST: 16 IU/L (ref 0–40)
Albumin/Globulin Ratio: 2 (ref 1.2–2.2)
Albumin: 4.5 g/dL (ref 3.8–4.8)
Alkaline Phosphatase: 53 IU/L (ref 44–121)
BUN/Creatinine Ratio: 14 (ref 10–24)
BUN: 15 mg/dL (ref 8–27)
Bilirubin Total: 0.4 mg/dL (ref 0.0–1.2)
CO2: 25 mmol/L (ref 20–29)
Calcium: 9.3 mg/dL (ref 8.6–10.2)
Chloride: 106 mmol/L (ref 96–106)
Creatinine, Ser: 1.09 mg/dL (ref 0.76–1.27)
GFR calc Af Amer: 82 mL/min/{1.73_m2} (ref >59)
GFR calc non Af Amer: 71 mL/min/{1.73_m2} (ref >59)
Globulin, Total: 2.2 g/dL (ref 1.5–4.5)
Glucose: 105 mg/dL — ABNORMAL HIGH (ref 65–99)
Potassium: 4.6 mmol/L (ref 3.5–5.2)
Sodium: 142 mmol/L (ref 134–144)
Total Protein: 6.7 g/dL (ref 6.0–8.5)

## 2019-10-29 LAB — LIPID PANEL W/O CHOL/HDL RATIO
Cholesterol, Total: 162 mg/dL (ref 100–199)
HDL: 54 mg/dL (ref >39)
LDL Chol Calc (NIH): 91 mg/dL (ref 0–99)
Triglycerides: 89 mg/dL (ref 0–149)
VLDL Cholesterol Cal: 17 mg/dL (ref 5–40)

## 2019-10-29 LAB — TSH: TSH: 2.79 u[IU]/mL (ref 0.450–4.500)

## 2019-10-29 LAB — PSA: Prostate Specific Ag, Serum: 1.1 ng/mL (ref 0.0–4.0)

## 2019-10-29 NOTE — Progress Notes (Signed)
Contacted via MyChart  Good morning Brent Smith, your physical labs have returned and overall I have no concerns on these.  Continue all your current medications.  You are doing great!!!  Any questions? Keep being awesome!!  Thank you for allowing me to participate in your care. Kindest regards, Kailany Dinunzio

## 2020-04-27 ENCOUNTER — Ambulatory Visit: Payer: BC Managed Care – PPO | Admitting: Nurse Practitioner

## 2020-05-11 ENCOUNTER — Ambulatory Visit: Payer: BC Managed Care – PPO | Admitting: Nurse Practitioner

## 2020-05-25 ENCOUNTER — Ambulatory Visit: Payer: BC Managed Care – PPO | Admitting: Nurse Practitioner

## 2021-01-04 ENCOUNTER — Other Ambulatory Visit: Payer: Self-pay | Admitting: Nurse Practitioner

## 2021-01-06 ENCOUNTER — Ambulatory Visit (INDEPENDENT_AMBULATORY_CARE_PROVIDER_SITE_OTHER): Payer: BC Managed Care – PPO | Admitting: Nurse Practitioner

## 2021-01-06 ENCOUNTER — Encounter: Payer: Self-pay | Admitting: Nurse Practitioner

## 2021-01-06 ENCOUNTER — Other Ambulatory Visit: Payer: Self-pay

## 2021-01-06 VITALS — BP 126/77 | HR 82 | Temp 97.9°F | Ht 68.0 in | Wt 190.8 lb

## 2021-01-06 DIAGNOSIS — I1310 Hypertensive heart and chronic kidney disease without heart failure, with stage 1 through stage 4 chronic kidney disease, or unspecified chronic kidney disease: Secondary | ICD-10-CM | POA: Diagnosis not present

## 2021-01-06 DIAGNOSIS — K219 Gastro-esophageal reflux disease without esophagitis: Secondary | ICD-10-CM | POA: Diagnosis not present

## 2021-01-06 DIAGNOSIS — R7301 Impaired fasting glucose: Secondary | ICD-10-CM | POA: Diagnosis not present

## 2021-01-06 DIAGNOSIS — N1831 Chronic kidney disease, stage 3a: Secondary | ICD-10-CM | POA: Diagnosis not present

## 2021-01-06 DIAGNOSIS — Z1211 Encounter for screening for malignant neoplasm of colon: Secondary | ICD-10-CM

## 2021-01-06 DIAGNOSIS — Z23 Encounter for immunization: Secondary | ICD-10-CM

## 2021-01-06 DIAGNOSIS — E7800 Pure hypercholesterolemia, unspecified: Secondary | ICD-10-CM

## 2021-01-06 LAB — MICROALBUMIN, URINE WAIVED
Creatinine, Urine Waived: 300 mg/dL (ref 10–300)
Microalb, Ur Waived: 10 mg/L (ref 0–19)
Microalb/Creat Ratio: <30 mg/g (ref <30)

## 2021-01-06 MED ORDER — OMEPRAZOLE 20 MG PO CPDR: 20.0000 mg | DELAYED_RELEASE_CAPSULE | Freq: Every day | ORAL | 4 refills | Status: DC

## 2021-01-06 MED ORDER — ATORVASTATIN CALCIUM 40 MG PO TABS: ORAL_TABLET | ORAL | 4 refills | Status: DC

## 2021-01-06 MED ORDER — BENAZEPRIL HCL 40 MG PO TABS: ORAL_TABLET | ORAL | 4 refills | Status: DC

## 2021-01-06 NOTE — Assessment & Plan Note (Signed)
Chronic, ongoing.  BP at goal today in office and at goal on home readings, praised for this.  Recommend he monitor BP at least a few mornings a week at home and document.  DASH diet at home.  Continue current medication regimen and adjust as needed.  Labs today: CBC, CMP, TSH, urine ALB.  Return in 6 months.  Refills sent in.

## 2021-01-06 NOTE — Telephone Encounter (Signed)
Requested medications are due for refill today.  yes  Requested medications are on the active medications list.  yes  Last refill. 10/28/2019  Future visit scheduled.   Yes, today!  Notes to clinic.  Labs expired, Over due for OV.    Requested Prescriptions  Pending Prescriptions Disp Refills   atorvastatin (LIPITOR) 40 MG tablet [Pharmacy Med Name: ATORVASTATIN CALCIUM 40 MG TAB] 90 tablet 4    Sig: TAKE 1 TABLET BY MOUTH AT BEDTIME     Cardiovascular:  Antilipid - Statins Failed - 01/04/2021 10:10 AM      Failed - Total Cholesterol in normal range and within 360 days    Cholesterol, Total  Date Value Ref Range Status  10/28/2019 162 100 - 199 mg/dL Final   Cholesterol Piccolo, Waived  Date Value Ref Range Status  04/26/2018 212 (H) <200 mg/dL Final    Comment:                            Desirable                <200                         Borderline High      200- 239                         High                     >239           Failed - LDL in normal range and within 360 days    LDL Chol Calc (NIH)  Date Value Ref Range Status  10/28/2019 91 0 - 99 mg/dL Final          Failed - HDL in normal range and within 360 days    HDL  Date Value Ref Range Status  10/28/2019 54 >39 mg/dL Final          Failed - Triglycerides in normal range and within 360 days    Triglycerides  Date Value Ref Range Status  10/28/2019 89 0 - 149 mg/dL Final   Triglycerides Piccolo,Waived  Date Value Ref Range Status  04/26/2018 97 <150 mg/dL Final    Comment:                            Normal                   <150                         Borderline High     150 - 199                         High                200 - 499                         Very High                >499           Failed - Valid encounter within last 12 months    Recent Outpatient Visits  1 year ago Annual physical exam   [place], Ambridge T, NP   1 year ago  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   [facility] [place], Jolene T, NP   2 years ago 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   [facility] [place], Jolene T, NP   2 years ago Hypertensive heart/kidney disease without HF and with CKD stage III ([place])   [place], [place] T, NP   3 years ago Essential hypertension   [place] Kathrine Haddock, NP       Future Appointments             Today Cannady, Barbaraann Faster, NP [place], [place] - Patient is not pregnant       benazepril (LOTENSIN) 40 MG tablet [Pharmacy Med Name: BENAZEPRIL HCL 40 MG TAB] 90 tablet 4    Sig: TAKE 1 TABLET BY MOUTH ONCE DAILY     Cardiovascular:  ACE Inhibitors Failed - 01/04/2021 10:10 AM      Failed - Cr in normal range and within 180 days    Creatinine, Ser  Date Value Ref Range Status  10/28/2019 1.09 0.76 - 1.27 mg/dL Final          Failed - K in normal range and within 180 days    Potassium  Date Value Ref Range Status  10/28/2019 4.6 3.5 - 5.2 mmol/L Final          Failed - Valid encounter within last 6 months    Recent Outpatient Visits           1 year ago Annual physical exam   [place], Henrine Screws T, NP   1 year ago 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   [facility] [place], Jolene T, NP   2 years ago 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   [facility] [place], Jolene T, NP   2 years ago Hypertensive heart/kidney disease without HF and with CKD stage III ([place])   [place], Henrine Screws T, NP   3 years ago Essential hypertension   [place] Kathrine Haddock, NP       Future  Appointments             Today Venita Lick, NP [place], [place] - Patient is not pregnant      Passed - Last BP in normal range    BP Readings from Last 1 Encounters:  10/28/19 133/86           omeprazole (PRILOSEC) 20 MG capsule [Pharmacy Med Name: OMEPRAZOLE DR 20 MG CAP] 90 capsule 4    Sig: TAKE 1 CAPSULE BY MOUTH ONCE DAILY     Gastroenterology: Proton Pump Inhibitors Failed - 01/04/2021 10:10 AM      Failed - Valid encounter within last 12 months    Recent Outpatient Visits           1 year ago Annual physical exam   [place], Henrine Screws T, NP   1 year ago Hypertensive 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 heart  failure and with stage 3a chronic kidney disease   [facility] [place], Lennox T, NP   2 years ago 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

## 2021-01-06 NOTE — Assessment & Plan Note (Signed)
Chronic, stable recent labs since 2019.  Continue Benazepril for kidney protection and adjust as needed.  CMP and urine ALB today.

## 2021-01-06 NOTE — Progress Notes (Signed)
BP 126/77    Pulse 82    Temp 97.9 F (36.6 C) (Oral)    Ht 5\' 8"  (1.727 m)    Wt 190 lb 12.8 oz (86.5 kg)    SpO2 97%    BMI 29.01 kg/m    Subjective:    Patient ID: Brent Smith, male    DOB: 1955/11/16, 66 y.o.   MRN: [id number]  HPI: Brent Smith is a 66 y.o. male  Chief Complaint  Patient presents with   Gastroesophageal Reflux   Hyperlipidemia   Hypertension    Patient states he has been doing well with his readings. Patient denies having any concerns at today's visit.    IFG   HYPERTENSION / HYPERLIPIDEMIA Continues on Benazepril 40 MG and Lipitor 40 MG.   Satisfied with current treatment? yes Duration of hypertension: chronic BP monitoring frequency: a few times a month BP range: 120/70 range BP medication side effects: no Duration of hyperlipidemia: chronic Cholesterol medication side effects: no Cholesterol supplements: none Medication compliance: good compliance Aspirin: yes Recent stressors: no Recurrent headaches: no Visual changes: no Palpitations: no Dyspnea: no Chest pain: no Lower extremity edema: no Dizzy/lightheaded: no    CHRONIC KIDNEY DISEASE Is on Benazepril.  Labs have been stable since 2019.  Does have history of elevation in glucose on past labs. CKD status: stable Medications renally dose: yes Previous renal evaluation: no Pneumovax: Provided today -- PCV13 today Influenza Vaccine:  yes   GERD Continues on Prilosec daily.  Well-controlled.  GERD control status: stable  Satisfied with current treatment? yes Dysphagia: no Odynophagia:  no Hematemesis: no Blood in stool: no EGD: no    Relevant past medical, surgical, family and social history reviewed and updated as indicated. Interim medical history since our last visit reviewed. Allergies and medications reviewed and updated.  Review of Systems  Constitutional:  Negative for activity change, diaphoresis, fatigue and fever.  Respiratory:  Negative for cough, chest  tightness, shortness of breath and wheezing.   Cardiovascular:  Negative for chest pain, palpitations and leg swelling.  Gastrointestinal: Negative.   Endocrine: Negative.   Neurological: Negative.   Psychiatric/Behavioral: Negative.     Per HPI unless specifically indicated above     Objective:    BP 126/77    Pulse 82    Temp 97.9 F (36.6 C) (Oral)    Ht 5\' 8"  (1.727 m)    Wt 190 lb 12.8 oz (86.5 kg)    SpO2 97%    BMI 29.01 kg/m   Wt Readings from Last 3 Encounters:  01/06/21 190 lb 12.8 oz (86.5 kg)  10/28/19 193 lb (87.5 kg)  04/22/19 194 lb 6 oz (88.2 kg)    Physical Exam Vitals and nursing note reviewed.  Constitutional:      General: He is awake. He is not in acute distress.    Appearance: He is well-developed and well-groomed. He is not ill-appearing or toxic-appearing.  HENT:     Head: Normocephalic and atraumatic.     Right Ear: Hearing normal. No drainage.     Left Ear: Hearing normal. No drainage.  Eyes:     General: Lids are normal.        Right eye: No discharge.        Left eye: No discharge.     Conjunctiva/sclera: Conjunctivae normal.     Pupils: Pupils are equal, round, and reactive to light.  Neck:     Vascular: No carotid bruit.  Cardiovascular:  Rate and Rhythm: Normal rate and regular rhythm.     Heart sounds: Normal heart sounds, S1 normal and S2 normal. No murmur heard.   No gallop.  Pulmonary:     Effort: Pulmonary effort is normal. No accessory muscle usage or respiratory distress.     Breath sounds: Normal breath sounds.  Abdominal:     General: Bowel sounds are normal.     Palpations: Abdomen is soft.  Musculoskeletal:        General: Normal range of motion.     Cervical back: Normal range of motion and neck supple.     Right lower leg: No edema.     Left lower leg: No edema.  Skin:    General: Skin is warm and dry.     Capillary Refill: Capillary refill takes less than 2 seconds.  Neurological:     Mental Status: He is alert  and oriented to person, place, and time.  Psychiatric:        Attention and Perception: Attention normal.        Mood and Affect: Mood normal.        Speech: Speech normal.        Behavior: Behavior normal. Behavior is cooperative.        Thought Content: Thought content normal.    Results for orders placed or performed in visit on 10/28/19  CBC with Differential/Platelet  Result Value Ref Range   WBC 5.3 3.4 - 10.8 x10E3/uL   RBC 4.57 4.14 - 5.80 x10E6/uL   Hemoglobin 13.9 13.0 - 17.7 g/dL   Hematocrit 16.143.3 09.637.5 - 51.0 %   MCV 95 79 - 97 fL   MCH 30.4 26.6 - 33.0 pg   MCHC 32.1 31.5 - 35.7 g/dL   RDW 04.512.8 40.911.6 - 81.115.4 %   Platelets 219 150 - 450 x10E3/uL   Neutrophils 60 Not Estab. %   Lymphs 27 Not Estab. %   Monocytes 9 Not Estab. %   Eos 3 Not Estab. %   Basos 1 Not Estab. %   Neutrophils Absolute 3.2 1.4 - 7.0 x10E3/uL   Lymphocytes Absolute 1.4 0.7 - 3.1 x10E3/uL   Monocytes Absolute 0.5 0.1 - 0.9 x10E3/uL   EOS (ABSOLUTE) 0.1 0.0 - 0.4 x10E3/uL   Basophils Absolute 0.0 0.0 - 0.2 x10E3/uL   Immature Granulocytes 0 Not Estab. %   Immature Grans (Abs) 0.0 0.0 - 0.1 x10E3/uL  Comprehensive metabolic panel  Result Value Ref Range   Glucose 105 (H) 65 - 99 mg/dL   BUN 15 8 - 27 mg/dL   Creatinine, Ser 9.141.09 0.76 - 1.27 mg/dL   GFR calc non Af Amer 71 >59 mL/min/1.73   GFR calc Af Amer 82 >59 mL/min/1.73   BUN/Creatinine Ratio 14 10 - 24   Sodium 142 134 - 144 mmol/L   Potassium 4.6 3.5 - 5.2 mmol/L   Chloride 106 96 - 106 mmol/L   CO2 25 20 - 29 mmol/L   Calcium 9.3 8.6 - 10.2 mg/dL   Total Protein 6.7 6.0 - 8.5 g/dL   Albumin 4.5 3.8 - 4.8 g/dL   Globulin, Total 2.2 1.5 - 4.5 g/dL   Albumin/Globulin Ratio 2.0 1.2 - 2.2   Bilirubin Total 0.4 0.0 - 1.2 mg/dL   Alkaline Phosphatase 53 44 - 121 IU/L   AST 16 0 - 40 IU/L   ALT 16 0 - 44 IU/L  Lipid Panel w/o Chol/HDL Ratio  Result Value Ref Range   Cholesterol,  Total 162 100 - 199 mg/dL   Triglycerides 89 0 - 149  mg/dL   HDL 54 >39 mg/dL   VLDL Cholesterol Cal 17 5 - 40 mg/dL   LDL Chol Calc (NIH) 91 0 - 99 mg/dL  TSH  Result Value Ref Range   TSH 2.790 0.450 - 4.500 uIU/mL  PSA  Result Value Ref Range   Prostate Specific Ag, Serum 1.1 0.0 - 4.0 ng/mL      Assessment & Plan:   Problem List Items Addressed This Visit       Cardiovascular and Mediastinum   Hypertensive heart/kidney disease without HF and with CKD stage III ([place]) - Primary    Chronic, ongoing.  BP at goal today in office and at goal on home readings, praised for this.  Recommend he monitor BP at least a few mornings a week at home and document.  DASH diet at home.  Continue current medication regimen and adjust as needed.  Labs today: CBC, CMP, TSH, urine ALB.  Return in 6 months.  Refills sent in.       Relevant Medications   atorvastatin (LIPITOR) 40 MG tablet   benazepril (LOTENSIN) 40 MG tablet   Other Relevant Orders   Comprehensive metabolic panel   TSH   CBC with Differential/Platelet   Microalbumin, Urine Waived     Digestive   GERD (gastroesophageal reflux disease)    Chronic, stable.  Continue current medication regimen and adjust as needed.  Check Mag level today.      Relevant Medications   omeprazole (PRILOSEC) 20 MG capsule   Other Relevant Orders   Magnesium     Endocrine   Impaired fasting blood sugar    Noted on past labs, check A1c today and recommend focus on exercise and diet.      Relevant Orders   HgB A1c     Genitourinary   Chronic kidney disease, stage 3 (HCC)    Chronic, stable recent labs since 2019.  Continue Benazepril for kidney protection and adjust as needed.  CMP and urine ALB today.      Relevant Orders   Comprehensive metabolic panel     Other   Hyperlipidemia    Chronic, ongoing.  Continue current medication regimen and adjust as needed.  Lipid panel today.  Is fasting.      Relevant Medications   atorvastatin (LIPITOR) 40 MG tablet   benazepril (LOTENSIN) 40 MG  tablet   Other Relevant Orders   Comprehensive metabolic panel   Lipid Panel w/o Chol/HDL Ratio   Other Visit Diagnoses     Pneumococcal vaccination given       PCV13 provided today and educated on this.   Relevant Orders   Pneumococcal conjugate vaccine 13-valent   Colon cancer screening       GI referral placed.   Relevant Orders   Ambulatory referral to Gastroenterology        Follow up plan: Return in about 6 months (around 07/06/2021) for Annual physical.

## 2021-01-06 NOTE — Patient Instructions (Signed)
Pneumococcal Conjugate Vaccine: What You Need to Know 1. Why get vaccinated? Pneumococcal conjugate vaccine can prevent pneumococcal disease. Pneumococcal disease refers to any illness caused by pneumococcal bacteria. These bacteria can cause many types of illnesses, including pneumonia, which is an infection of the lungs. Pneumococcal bacteria are one of the most common causes of pneumonia. Besides pneumonia, pneumococcal bacteria can also cause: Ear infections Sinus infections Meningitis (infection of the tissue covering the brain and spinal cord) Bacteremia (infection of the blood) Anyone can get pneumococcal disease, but children under 2 years old, people with certain medical conditions or other risk factors, and adults 65 years or older are at the highest risk. Most pneumococcal infections are mild. However, some can result in long-term problems, such as brain damage or hearing loss. Meningitis, bacteremia, and pneumonia caused by pneumococcal disease can be fatal. 2. Pneumococcal conjugate vaccine Pneumococcal conjugate vaccine helps protect against bacteria that cause pneumococcal disease. There are three pneumococcal conjugate vaccines (PCV13, PCV15, and PCV20). The different vaccines are recommended for different people based on their age and medical status. PCV13 Infants and young children usually need 4 doses of PCV13, at ages 2, 4, 6, and 12-15 months. Older children (through age 59 months) may be vaccinated with PCV13 if they did not receive the recommended doses. Children and adolescents 6-18 years of age with certain medical conditions should receive a single dose of PCV13 if they did not already receive PCV13. PCV15 or PCV20 Adults 19 through 67 years old with certain medical conditions or other risk factors who have not already received a pneumococcal conjugate vaccine should receive either: a single dose of PCV15 followed by a dose of pneumococcal polysaccharide vaccine (PPSV23),  or a single dose of PCV20. Adults 65 years or older who have not already received a pneumococcal conjugate vaccine should receive either: a single dose of PCV15 followed by a dose of PPSV23, or a single dose of PCV20. Your health care provider can give you more information. 3. Talk with your health care provider Tell your vaccination provider if the person getting the vaccine: Has had an allergic reaction after a previous dose of any type of pneumococcal conjugate vaccine (PCV13, PCV15, PCV20, or an earlier pneumococcal conjugate vaccine known as PCV7), or to any vaccine containing diphtheria toxoid (for example, DTaP), or has any severe, life-threatening allergies In some cases, your health care provider may decide to postpone pneumococcal conjugate vaccination until a future visit. People with minor illnesses, such as a cold, may be vaccinated. People who are moderately or severely ill should usually wait until they recover. Your health care provider can give you more information. 4. Risks of a vaccine reaction Redness, swelling, pain, or tenderness where the shot is given, and fever, loss of appetite, fussiness (irritability), feeling tired, headache, muscle aches, joint pain, and chills can happen after pneumococcal conjugate vaccination. Young children may be at increased risk for seizures caused by fever after PCV13 if it is administered at the same time as inactivated influenza vaccine. Ask your health care provider for more information. People sometimes faint after medical procedures, including vaccination. Tell your provider if you feel dizzy or have vision changes or ringing in the ears. As with any medicine, there is a very remote chance of a vaccine causing a severe allergic reaction, other serious injury, or death. 5. What if there is a serious problem? An allergic reaction could occur after the vaccinated person leaves the clinic. If you see signs of a severe allergic   reaction (hives,  swelling of the face and throat, difficulty breathing, a fast heartbeat, dizziness, or weakness), call 9-1-1 and get the person to the nearest hospital. For other signs that concern you, call your health care provider. Adverse reactions should be reported to the Vaccine Adverse Event Reporting System (VAERS). Your health care provider will usually file this report, or you can do it yourself. Visit the VAERS website at www.vaers.hhs.gov or call 1-800-822-7967. VAERS is only for reporting reactions, and VAERS staff members do not give medical advice. 6. The National Vaccine Injury Compensation Program The National Vaccine Injury Compensation Program (VICP) is a federal program that was created to compensate people who may have been injured by certain vaccines. Claims regarding alleged injury or death due to vaccination have a time limit for filing, which may be as short as two years. Visit the VICP website at www.hrsa.gov/vaccinecompensation or call 1-800-338-2382 to learn about the program and about filing a claim. 7. How can I learn more? Ask your health care provider. Call your local or state health department. Visit the website of the Food and Drug Administration (FDA) for vaccine package inserts and additional information at www.fda.gov/vaccines-blood-biologics/vaccines. Contact the Centers for Disease Control and Prevention (CDC): Call 1-800-232-4636 (1-800-CDC-INFO) or Visit CDC's website at www.cdc.gov/vaccines. Vaccine Information Statement (Interim) Pneumococcal Conjugate Vaccine (02/07/2020) This information is not intended to replace advice given to you by your health care provider. Make sure you discuss any questions you have with your health care provider. Document Revised: 09/04/2020 Document Reviewed: 02/21/2020 Elsevier Patient Education  2022 Elsevier Inc.  

## 2021-01-06 NOTE — Assessment & Plan Note (Signed)
Chronic, stable.  Continue current medication regimen and adjust as needed.  Check Mag level today.

## 2021-01-06 NOTE — Assessment & Plan Note (Signed)
Noted on past labs, check A1c today and recommend focus on exercise and diet. 

## 2021-01-06 NOTE — Assessment & Plan Note (Signed)
Chronic, ongoing.  Continue current medication regimen and adjust as needed.  Lipid panel today.  Is fasting. 

## 2021-01-07 LAB — CBC WITH DIFFERENTIAL/PLATELET
Basophils Absolute: 0 10*3/uL (ref 0.0–0.2)
Basos: 1 %
EOS (ABSOLUTE): 0.1 10*3/uL (ref 0.0–0.4)
Eos: 2 %
Hematocrit: 40.8 % (ref 37.5–51.0)
Hemoglobin: 13.9 g/dL (ref 13.0–17.7)
Immature Grans (Abs): 0 10*3/uL (ref 0.0–0.1)
Immature Granulocytes: 0 %
Lymphocytes Absolute: 1.4 10*3/uL (ref 0.7–3.1)
Lymphs: 30 %
MCH: 31.4 pg (ref 26.6–33.0)
MCHC: 34.1 g/dL (ref 31.5–35.7)
MCV: 92 fL (ref 79–97)
Monocytes Absolute: 0.4 10*3/uL (ref 0.1–0.9)
Monocytes: 9 %
Neutrophils Absolute: 2.7 10*3/uL (ref 1.4–7.0)
Neutrophils: 58 %
Platelets: 225 10*3/uL (ref 150–450)
RBC: 4.42 x10E6/uL (ref 4.14–5.80)
RDW: 13 % (ref 11.6–15.4)
WBC: 4.7 10*3/uL (ref 3.4–10.8)

## 2021-01-07 LAB — COMPREHENSIVE METABOLIC PANEL
ALT: 15 IU/L (ref 0–44)
AST: 18 IU/L (ref 0–40)
Albumin/Globulin Ratio: 2.2 (ref 1.2–2.2)
Albumin: 4.7 g/dL (ref 3.8–4.8)
Alkaline Phosphatase: 55 IU/L (ref 44–121)
BUN/Creatinine Ratio: 12 (ref 10–24)
BUN: 14 mg/dL (ref 8–27)
Bilirubin Total: 0.3 mg/dL (ref 0.0–1.2)
CO2: 24 mmol/L (ref 20–29)
Calcium: 9.3 mg/dL (ref 8.6–10.2)
Chloride: 104 mmol/L (ref 96–106)
Creatinine, Ser: 1.15 mg/dL (ref 0.76–1.27)
Globulin, Total: 2.1 g/dL (ref 1.5–4.5)
Glucose: 102 mg/dL — ABNORMAL HIGH (ref 70–99)
Potassium: 4.7 mmol/L (ref 3.5–5.2)
Sodium: 141 mmol/L (ref 134–144)
Total Protein: 6.8 g/dL (ref 6.0–8.5)
eGFR: 70 mL/min/{1.73_m2} (ref >59)

## 2021-01-07 LAB — MAGNESIUM: Magnesium: 2.2 mg/dL (ref 1.6–2.3)

## 2021-01-07 LAB — LIPID PANEL W/O CHOL/HDL RATIO
Cholesterol, Total: 177 mg/dL (ref 100–199)
HDL: 57 mg/dL (ref >39)
LDL Chol Calc (NIH): 109 mg/dL — ABNORMAL HIGH (ref 0–99)
Triglycerides: 57 mg/dL (ref 0–149)
VLDL Cholesterol Cal: 11 mg/dL (ref 5–40)

## 2021-01-07 LAB — TSH: TSH: 2.97 u[IU]/mL (ref 0.450–4.500)

## 2021-01-07 LAB — HEMOGLOBIN A1C
Est. average glucose Bld gHb Est-mCnc: 111 mg/dL
Hgb A1c MFr Bld: 5.5 % (ref 4.8–5.6)

## 2021-01-07 NOTE — Progress Notes (Signed)
Contacted via MyChart   Good morning Brent Smith, your labs have returned: - CMP shows normal kidney function, creatinine and eGFR, plus normal liver function, AST and ALT. - A1c shows no diabetes or prediabetes. - Thyroid level, magnesium, and CBC all normal. - Cholesterol levels continue to show LDL above goal, would love to see less then 100 consistently or even less then 70 for stroke or heart attack prevention.  Are you taking Atorvastatin consistently? If so I recommend we go up to 80 MG on this and if next visit you remain above goal we may switch to a different statin -- Rosuvastatin.  Any questions?  Let me know if you are okay with this change. Keep being awesome!!  Thank you for allowing me to participate in your care.  I appreciate you. Kindest regards, Yuliana Vandrunen

## 2021-04-15 ENCOUNTER — Other Ambulatory Visit: Payer: Self-pay | Admitting: Nurse Practitioner

## 2021-04-15 DIAGNOSIS — T753XXD Motion sickness, subsequent encounter: Secondary | ICD-10-CM

## 2021-04-16 NOTE — Telephone Encounter (Signed)
Requested medications are due for refill today.  Provider to determine. ? ?Requested medications are on the active medications list.  yes ? ?Last refill. 11/03/2019 #10 0 refills ? ?Future visit scheduled.   yes ? ?Notes to clinic.  Medication refill is not assigned a protocol. ? ? ? ?Requested Prescriptions  ?Pending Prescriptions Disp Refills  ? scopolamine (TRANSDERM-SCOP) 1 MG/3DAYS [Pharmacy Med Name: SCOPOLAMINE 1 MG/3DAYS PAT]    ?  Sig: PLACE 1 PATCH ONTO THE SKIN EVERY 3(THREE) DAYS  ?  ? Off-Protocol Failed - 04/15/2021  4:17 PM  ?  ?  Failed - Medication not assigned to a protocol, review manually.  ?  ?  Passed - Valid encounter within last 12 months  ?  Recent Outpatient Visits   ? ?      ? 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 [place], Henrine Screws T, NP  ? 1 year ago Annual physical exam  ? [facility] [place], [place] T, NP  ? 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 [place], [place] T, NP  ? 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 [place], [place] T, NP  ? 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 [facility] [place], Henrine Screws T, NP  ? ?  ?  ?Future Appointments   ? ?        ? In 3 months [place], Barbaraann Faster, NP [facility]  ? ?  ? ?  ?  ?  ?  ?

## 2021-07-19 ENCOUNTER — Encounter: Payer: BC Managed Care – PPO | Admitting: Nurse Practitioner

## 2021-08-16 ENCOUNTER — Encounter: Payer: BC Managed Care – PPO | Admitting: Nurse Practitioner

## 2021-08-16 DIAGNOSIS — R7301 Impaired fasting glucose: Secondary | ICD-10-CM

## 2021-08-16 DIAGNOSIS — K219 Gastro-esophageal reflux disease without esophagitis: Secondary | ICD-10-CM

## 2021-08-16 DIAGNOSIS — Z0000 Encounter for general adult medical examination without abnormal findings: Secondary | ICD-10-CM

## 2021-08-16 DIAGNOSIS — E7800 Pure hypercholesterolemia, unspecified: Secondary | ICD-10-CM

## 2021-08-16 DIAGNOSIS — N400 Enlarged prostate without lower urinary tract symptoms: Secondary | ICD-10-CM

## 2021-08-16 DIAGNOSIS — N1831 Chronic kidney disease, stage 3a: Secondary | ICD-10-CM

## 2022-01-08 NOTE — Patient Instructions (Signed)

## 2022-01-10 ENCOUNTER — Encounter: Payer: Self-pay | Admitting: Nurse Practitioner

## 2022-01-10 ENCOUNTER — Ambulatory Visit (INDEPENDENT_AMBULATORY_CARE_PROVIDER_SITE_OTHER): Payer: BC Managed Care – PPO | Admitting: Nurse Practitioner

## 2022-01-10 VITALS — BP 128/74 | HR 81 | Temp 97.3°F | Ht 67.99 in | Wt 199.9 lb

## 2022-01-10 DIAGNOSIS — I10 Essential (primary) hypertension: Secondary | ICD-10-CM | POA: Diagnosis not present

## 2022-01-10 DIAGNOSIS — Z0000 Encounter for general adult medical examination without abnormal findings: Secondary | ICD-10-CM | POA: Diagnosis not present

## 2022-01-10 DIAGNOSIS — R7301 Impaired fasting glucose: Secondary | ICD-10-CM

## 2022-01-10 DIAGNOSIS — E7800 Pure hypercholesterolemia, unspecified: Secondary | ICD-10-CM | POA: Diagnosis not present

## 2022-01-10 DIAGNOSIS — N400 Enlarged prostate without lower urinary tract symptoms: Secondary | ICD-10-CM

## 2022-01-10 DIAGNOSIS — Z23 Encounter for immunization: Secondary | ICD-10-CM

## 2022-01-10 DIAGNOSIS — N1831 Chronic kidney disease, stage 3a: Secondary | ICD-10-CM | POA: Diagnosis not present

## 2022-01-10 DIAGNOSIS — Z1211 Encounter for screening for malignant neoplasm of colon: Secondary | ICD-10-CM | POA: Diagnosis not present

## 2022-01-10 DIAGNOSIS — K219 Gastro-esophageal reflux disease without esophagitis: Secondary | ICD-10-CM

## 2022-01-10 DIAGNOSIS — I1310 Hypertensive heart and chronic kidney disease without heart failure, with stage 1 through stage 4 chronic kidney disease, or unspecified chronic kidney disease: Secondary | ICD-10-CM

## 2022-01-10 LAB — MICROALBUMIN, URINE WAIVED
Creatinine, Urine Waived: 100 mg/dL (ref 10–300)
Microalb, Ur Waived: 30 mg/L — ABNORMAL HIGH (ref 0–19)
Microalb/Creat Ratio: <30 mg/g (ref <30)

## 2022-01-10 MED ORDER — BENAZEPRIL HCL 40 MG PO TABS: ORAL_TABLET | ORAL | 4 refills | Status: DC

## 2022-01-10 MED ORDER — OMEPRAZOLE 20 MG PO CPDR: 20.0000 mg | DELAYED_RELEASE_CAPSULE | Freq: Every day | ORAL | 4 refills | Status: DC

## 2022-01-10 MED ORDER — ATORVASTATIN CALCIUM 40 MG PO TABS: ORAL_TABLET | ORAL | 4 refills | Status: DC

## 2022-01-10 NOTE — Assessment & Plan Note (Signed)
Chronic, stable with BP at goal on check today.  Recommend he monitor BP at least a few mornings a week at home and document.  DASH diet at home.  Continue current medication regimen and adjust as needed.  Labs today: CBC, CMP, TSH, urine ALB (urine ALB 01 February 2022).  Return in one year.  Refills sent.

## 2022-01-10 NOTE — Progress Notes (Signed)
BP 128/74 (BP Location: Left Arm)   Pulse 81   Temp (!) 97.3 F (36.3 C) (Oral)   Ht 5' 7.99" (1.727 m)   Wt 199 lb 14.4 oz (90.7 kg)   SpO2 96%   BMI 30.40 kg/m    Subjective:    Patient ID: Brent Smith, male    DOB: 1955/07/27, 67 y.o.   MRN: [id number]  HPI: Brent Smith is a 66 y.o. male presenting on 01/10/2022 for comprehensive medical examination. Current medical complaints include: none  He currently lives with: significant other Interim Problems from his last visit: no   HYPERTENSION / HYPERLIPIDEMIA Continues on Benazepril 40 MG and Lipitor 40 MG.   Satisfied with current treatment? yes Duration of hypertension: chronic BP monitoring frequency: a few times a month BP range: 120/70 range BP medication side effects: no Duration of hyperlipidemia: chronic Cholesterol medication side effects: no Cholesterol supplements: none Medication compliance: good compliance Aspirin: yes Recent stressors: no Recurrent headaches: no Visual changes: no Palpitations: no Dyspnea: no Chest pain: no Lower extremity edema: no Dizzy/lightheaded: no    CHRONIC KIDNEY DISEASE Is on Benazepril.  Labs have been stable since 2019.  Does have history of elevation in glucose on past labs, A1c last visit 5.5%. CKD status: stable Medications renally dose: yes Previous renal evaluation: no Pneumovax: Up To Date Influenza Vaccine:  Up To Date   GERD Continues on Prilosec daily.   GERD control status: stable  Satisfied with current treatment? yes Dysphagia: no Odynophagia:  no Hematemesis: no Blood in stool: no EGD: no   Functional Status Survey: Is the patient deaf or have difficulty hearing?: No Does the patient have difficulty seeing, even when wearing glasses/contacts?: No Does the patient have difficulty concentrating, remembering, or making decisions?: No Does the patient have difficulty walking or climbing stairs?: No Does the patient have difficulty dressing or  bathing?: No Does the patient have difficulty doing errands alone such as visiting a doctor's office or shopping?: No  FALL RISK:    01/10/2022   10:01 AM 01/06/2021    9:14 AM 10/28/2019    8:57 AM 10/22/2018    7:57 AM 03/03/2017    8:11 AM  [place] in the past year? 0 0 0 0 No  Number falls in past yr: 0 0 0 0   Injury with Fall? 0 0 0 0   Risk for fall due to : No Fall Risks No Fall Risks No Fall Risks    Follow up Falls evaluation completed Falls evaluation completed Falls evaluation completed Falls evaluation completed     Depression Screen    01/10/2022   10:02 AM 01/06/2021    9:14 AM 10/28/2019    9:03 AM 10/28/2019    8:58 AM 10/22/2018    7:58 AM  Depression screen PHQ 2/9  Decreased Interest 0 0 0 0 0  Down, Depressed, Hopeless 0 0 0 0 0  PHQ - 2 Score 0 0 0 0 0  Altered sleeping 0      Tired, decreased energy 0      Change in appetite 0      Feeling bad or failure about yourself  0      Trouble concentrating 0      Moving slowly or fidgety/restless 0      Suicidal thoughts 0      PHQ-9 Score 0      Difficult doing work/chores Not difficult at all  Past Medical History:  Past Medical History:  Diagnosis Date   GERD (gastroesophageal reflux disease)    Hyperlipidemia    Hypertension     Surgical History:  Past Surgical History:  Procedure Laterality Date   SHOULDER SURGERY  2009   TONSILLECTOMY      Medications:  Current Outpatient Medications on File Prior to Visit  Medication Sig   aspirin 81 MG tablet Take 81 mg by mouth daily.   scopolamine (TRANSDERM-SCOP) 1 MG/3DAYS PLACE 1 PATCH ONTO THE SKIN EVERY 3(THREE) DAYS   No current facility-administered medications on file prior to visit.    Allergies:  No Known Allergies  Social History:  Social History   Socioeconomic History   Marital status: Married    Spouse name: Not on file   Number of children: Not on file   Years of education: Not on file   Highest education level:  Not on file  Occupational History   Not on file  Tobacco Use   Smoking status: Former   Smokeless tobacco: Never  Vaping Use   Vaping Use: Never used  Substance and Sexual Activity   Alcohol use: Yes    Comment: pt states on occasion or on vacation   Drug use: No   Sexual activity: Yes  Other Topics Concern   Not on file  Social History Narrative   Not on file   Social Determinants of Health   Financial Resource Strain: Not on file  Food Insecurity: Not on file  Transportation Needs: Not on file  Physical Activity: Not on file  Stress: Not on file  Social Connections: Not on file  Intimate Partner Violence: Not on file   Social History   Tobacco Use  Smoking Status Former  Smokeless Tobacco Never   Social History   Substance and Sexual Activity  Alcohol Use Yes   Comment: pt states on occasion or on vacation    Family History:  Family History  Problem Relation Age of Onset   Hypertension Mother    Parkinson's disease Mother    Hypertension Father    Dementia Father    Hypertension Sister    Hypertension Brother    Hypertension Maternal Grandfather     Past medical history, surgical history, medications, allergies, family history and social history reviewed with patient today and changes made to appropriate areas of the chart.   Review of Systems -  Back pain All other ROS negative except what is listed above and in the HPI.      Objective:    BP 128/74 (BP Location: Left Arm)   Pulse 81   Temp (!) 97.3 F (36.3 C) (Oral)   Ht 5' 7.99" (1.727 m)   Wt 199 lb 14.4 oz (90.7 kg)   SpO2 96%   BMI 30.40 kg/m   Wt Readings from Last 3 Encounters:  01/10/22 199 lb 14.4 oz (90.7 kg)  01/06/21 190 lb 12.8 oz (86.5 kg)  10/28/19 193 lb (87.5 kg)    Physical Exam Vitals and nursing note reviewed.  Constitutional:      General: He is awake. He is not in acute distress.    Appearance: He is well-developed and overweight. He is not ill-appearing.  HENT:      Head: Normocephalic and atraumatic.     Right Ear: Hearing, tympanic membrane, ear canal and external ear normal. No drainage.     Left Ear: Hearing, tympanic membrane, ear canal and external ear normal. No drainage.  Nose: Nose normal.     Mouth/Throat:     Mouth: Mucous membranes are moist.     Pharynx: Uvula midline.  Eyes:     General: Lids are normal.        Right eye: No discharge.        Left eye: No discharge.     Extraocular Movements: Extraocular movements intact.     Conjunctiva/sclera: Conjunctivae normal.     Pupils: Pupils are equal, round, and reactive to light.     Visual Fields: Right eye visual fields normal and left eye visual fields normal.  Neck:     Thyroid: No thyromegaly.     Vascular: No carotid bruit or JVD.  Cardiovascular:     Rate and Rhythm: Normal rate and regular rhythm.     Heart sounds: Normal heart sounds, S1 normal and S2 normal. No murmur heard.    No gallop.  Pulmonary:     Effort: Pulmonary effort is normal. No accessory muscle usage or respiratory distress.     Breath sounds: Normal breath sounds.  Abdominal:     General: Bowel sounds are normal.     Palpations: Abdomen is soft. There is no hepatomegaly or splenomegaly.     Tenderness: There is no abdominal tenderness.  Musculoskeletal:        General: Normal range of motion.     Cervical back: Normal range of motion and neck supple.     Right lower leg: No edema.     Left lower leg: No edema.  Lymphadenopathy:     Head:     Right side of head: No submental, submandibular, tonsillar, preauricular or posterior auricular adenopathy.     Left side of head: No submental, submandibular, tonsillar, preauricular or posterior auricular adenopathy.     Cervical: No cervical adenopathy.  Skin:    General: Skin is warm and dry.     Capillary Refill: Capillary refill takes less than 2 seconds.     Findings: No rash.  Neurological:     Mental Status: He is alert and oriented to person,  place, and time.     Gait: Gait is intact.     Deep Tendon Reflexes: Reflexes are normal and symmetric.     Reflex Scores:      Brachioradialis reflexes are 2+ on the right side and 2+ on the left side.      Patellar reflexes are 2+ on the right side and 2+ on the left side. Psychiatric:        Attention and Perception: Attention normal.        Mood and Affect: Mood normal.        Speech: Speech normal.        Behavior: Behavior normal. Behavior is cooperative.        Thought Content: Thought content normal.        Cognition and Memory: Cognition normal.        Judgment: Judgment normal.    Results for orders placed or performed in visit on 01/06/21  Comprehensive metabolic panel  Result Value Ref Range   Glucose 102 (H) 70 - 99 mg/dL   BUN 14 8 - 27 mg/dL   Creatinine, Ser 1.15 0.76 - 1.27 mg/dL   eGFR 70 >59 mL/min/1.73   BUN/Creatinine Ratio 12 10 - 24   Sodium 141 134 - 144 mmol/L   Potassium 4.7 3.5 - 5.2 mmol/L   Chloride 104 96 - 106 mmol/L   CO2 24 20 -  29 mmol/L   Calcium 9.3 8.6 - 10.2 mg/dL   Total Protein 6.8 6.0 - 8.5 g/dL   Albumin 4.7 3.8 - 4.8 g/dL   Globulin, Total 2.1 1.5 - 4.5 g/dL   Albumin/Globulin Ratio 2.2 1.2 - 2.2   Bilirubin Total 0.3 0.0 - 1.2 mg/dL   Alkaline Phosphatase 55 44 - 121 IU/L   AST 18 0 - 40 IU/L   ALT 15 0 - 44 IU/L  Lipid Panel w/o Chol/HDL Ratio  Result Value Ref Range   Cholesterol, Total 177 100 - 199 mg/dL   Triglycerides 57 0 - 149 mg/dL   HDL 57 >39 mg/dL   VLDL Cholesterol Cal 11 5 - 40 mg/dL   LDL Chol Calc (NIH) 109 (H) 0 - 99 mg/dL  TSH  Result Value Ref Range   TSH 2.970 0.450 - 4.500 uIU/mL  CBC with Differential/Platelet  Result Value Ref Range   WBC 4.7 3.4 - 10.8 x10E3/uL   RBC 4.42 4.14 - 5.80 x10E6/uL   Hemoglobin 13.9 13.0 - 17.7 g/dL   Hematocrit 40.8 37.5 - 51.0 %   MCV 92 79 - 97 fL   MCH 31.4 26.6 - 33.0 pg   MCHC 34.1 31.5 - 35.7 g/dL   RDW 13.0 11.6 - 15.4 %   Platelets 225 150 - 450 x10E3/uL    Neutrophils 58 Not Estab. %   Lymphs 30 Not Estab. %   Monocytes 9 Not Estab. %   Eos 2 Not Estab. %   Basos 1 Not Estab. %   Neutrophils Absolute 2.7 1.4 - 7.0 x10E3/uL   Lymphocytes Absolute 1.4 0.7 - 3.1 x10E3/uL   Monocytes Absolute 0.4 0.1 - 0.9 x10E3/uL   EOS (ABSOLUTE) 0.1 0.0 - 0.4 x10E3/uL   Basophils Absolute 0.0 0.0 - 0.2 x10E3/uL   Immature Granulocytes 0 Not Estab. %   Immature Grans (Abs) 0.0 0.0 - 0.1 x10E3/uL  HgB A1c  Result Value Ref Range   Hgb A1c MFr Bld 5.5 4.8 - 5.6 %   Est. average glucose Bld gHb Est-mCnc 111 mg/dL  Magnesium  Result Value Ref Range   Magnesium 2.2 1.6 - 2.3 mg/dL  Microalbumin, Urine Waived  Result Value Ref Range   Microalb, Ur Waived 10 0 - 19 mg/L   Creatinine, Urine Waived 300 10 - 300 mg/dL   Microalb/Creat Ratio <30 <30 mg/g      Assessment & Plan:   Problem List Items Addressed This Visit       Cardiovascular and Mediastinum   Essential hypertension    Chronic, stable with BP at goal on check today.  Recommend he monitor BP at least a few mornings a week at home and document.  DASH diet at home.  Continue current medication regimen and adjust as needed.  Labs today: CBC, CMP, TSH, urine ALB (urine ALB 01 February 2022).  Return in one year.  Refills sent.       Relevant Medications   atorvastatin (LIPITOR) 40 MG tablet   benazepril (LOTENSIN) 40 MG tablet   Other Relevant Orders   CBC with Differential/Platelet   Comprehensive metabolic panel   TSH     Digestive   GERD (gastroesophageal reflux disease)    Chronic, stable.  Continue current medication regimen and adjust as needed.  Check Mag level today. Risks of PPI use were discussed with patient including bone loss, C. Diff diarrhea, pneumonia, infections, CKD, electrolyte abnormalities.  Verbalizes understanding and chooses to continue the medication.  Relevant Medications   omeprazole (PRILOSEC) 20 MG capsule     Endocrine   Impaired fasting blood sugar     Noted on past labs, check A1c today and recommend focus on exercise and diet.      Relevant Orders   HgB A1c     Genitourinary   Chronic kidney disease, stage 3 (HCC) - Primary    Chronic, stable recent labs since 2019.  Continue Benazepril for kidney protection and adjust as needed.  CMP and urine ALB today.  Discontinue diagnosis if remains stable.      Relevant Orders   Microalbumin, Urine Waived   Comprehensive metabolic panel     Other   Hyperlipidemia    Chronic, ongoing.  Continue current medication regimen and adjust as needed.  Lipid panel today.  Is fasting.      Relevant Medications   atorvastatin (LIPITOR) 40 MG tablet   benazepril (LOTENSIN) 40 MG tablet   Other Relevant Orders   Comprehensive metabolic panel   Lipid Panel w/o Chol/HDL Ratio   Other Visit Diagnoses     Benign prostatic hyperplasia without lower urinary tract symptoms       PSA obtained in office today.   Relevant Orders   PSA   Pneumococcal vaccination given       PCV20 provided in office today.   Relevant Orders   Pneumococcal conjugate vaccine 20-valent (Prevnar 20) (Completed)   Colon cancer screening       GI referral placed   Relevant Orders   Ambulatory referral to Gastroenterology   Encounter for annual physical exam       Annual physical today with labs and health maintenance reviewed, discussed with patient.       Discussed aspirin prophylaxis for myocardial infarction prevention and decision was it was not indicated  LABORATORY TESTING:  Health maintenance labs ordered today as discussed above.   The natural history of prostate cancer and ongoing controversy regarding screening and potential treatment outcomes of prostate cancer has been discussed with the patient. The meaning of a false positive PSA and a false negative PSA has been discussed. He indicates understanding of the limitations of this screening test and wishes to proceed with screening PSA  testing.   IMMUNIZATIONS:   - Tdap: Tetanus vaccination status reviewed: last tetanus booster within 10 years. - Influenza: Received at work - Pneumovax: Up To Date - Prevnar: Up To Date - Zostavax vaccine:  Wishes to think about this  SCREENING: - Colonoscopy: Ordered today  Discussed with patient purpose of the colonoscopy is to detect colon cancer at curable precancerous or early stages   - AAA Screening: Not applicable  -Hearing Test: Not applicable  -Spirometry: Not applicable   PATIENT COUNSELING:    Sexuality: Discussed sexually transmitted diseases, partner selection, use of condoms, avoidance of unintended pregnancy  and contraceptive alternatives.   Advised to avoid cigarette smoking.  I discussed with the patient that most people either abstain from alcohol or drink within safe limits (<=14/week and <=4 drinks/occasion for males, <=7/weeks and <= 3 drinks/occasion for females) and that the risk for alcohol disorders and other health effects rises proportionally with the number of drinks per week and how often a drinker exceeds daily limits.  Discussed cessation/primary prevention of drug use and availability of treatment for abuse.   Diet: Encouraged to adjust caloric intake to maintain  or achieve ideal body weight, to reduce intake of dietary saturated fat and total fat, to limit sodium intake  by avoiding high sodium foods and not adding table salt, and to maintain adequate dietary potassium and calcium preferably from fresh fruits, vegetables, and low-fat dairy products.    Stressed the importance of regular exercise  Injury prevention: Discussed safety belts, safety helmets, smoke detector, smoking near bedding or upholstery.   Dental health: Discussed importance of regular tooth brushing, flossing, and dental visits.   Follow up plan: NEXT PREVENTATIVE PHYSICAL DUE IN 1 YEAR. Return in about 1 year (around 01/11/2023) for Annual physical.

## 2022-01-10 NOTE — Assessment & Plan Note (Signed)
Chronic, stable recent labs since 2019.  Continue Benazepril for kidney protection and adjust as needed.  CMP and urine ALB today.  Discontinue diagnosis if remains stable.

## 2022-01-10 NOTE — Assessment & Plan Note (Signed)
Chronic, ongoing.  Continue current medication regimen and adjust as needed.  Lipid panel today.  Is fasting.

## 2022-01-10 NOTE — Assessment & Plan Note (Signed)
Noted on past labs, check A1c today and recommend focus on exercise and diet.

## 2022-01-10 NOTE — Assessment & Plan Note (Signed)
Chronic, stable.  Continue current medication regimen and adjust as needed.  Check Mag level today. Risks of PPI use were discussed with patient including bone loss, C. Diff diarrhea, pneumonia, infections, CKD, electrolyte abnormalities.  Verbalizes understanding and chooses to continue the medication.

## 2022-01-11 LAB — COMPREHENSIVE METABOLIC PANEL
ALT: 16 IU/L (ref 0–44)
AST: 15 IU/L (ref 0–40)
Albumin/Globulin Ratio: 1.7 (ref 1.2–2.2)
Albumin: 4.6 g/dL (ref 3.9–4.9)
Alkaline Phosphatase: 61 IU/L (ref 44–121)
BUN/Creatinine Ratio: 13 (ref 10–24)
BUN: 15 mg/dL (ref 8–27)
Bilirubin Total: 0.3 mg/dL (ref 0.0–1.2)
CO2: 23 mmol/L (ref 20–29)
Calcium: 9.8 mg/dL (ref 8.6–10.2)
Chloride: 103 mmol/L (ref 96–106)
Creatinine, Ser: 1.13 mg/dL (ref 0.76–1.27)
Globulin, Total: 2.7 g/dL (ref 1.5–4.5)
Glucose: 109 mg/dL — ABNORMAL HIGH (ref 70–99)
Potassium: 4.5 mmol/L (ref 3.5–5.2)
Sodium: 139 mmol/L (ref 134–144)
Total Protein: 7.3 g/dL (ref 6.0–8.5)
eGFR: 71 mL/min/{1.73_m2} (ref >59)

## 2022-01-11 LAB — CBC WITH DIFFERENTIAL/PLATELET
Basophils Absolute: 0 10*3/uL (ref 0.0–0.2)
Basos: 1 %
EOS (ABSOLUTE): 0.1 10*3/uL (ref 0.0–0.4)
Eos: 2 %
Hematocrit: 43.5 % (ref 37.5–51.0)
Hemoglobin: 14.3 g/dL (ref 13.0–17.7)
Immature Grans (Abs): 0 10*3/uL (ref 0.0–0.1)
Immature Granulocytes: 0 %
Lymphocytes Absolute: 1.3 10*3/uL (ref 0.7–3.1)
Lymphs: 21 %
MCH: 30.4 pg (ref 26.6–33.0)
MCHC: 32.9 g/dL (ref 31.5–35.7)
MCV: 92 fL (ref 79–97)
Monocytes Absolute: 0.5 10*3/uL (ref 0.1–0.9)
Monocytes: 9 %
Neutrophils Absolute: 4.1 10*3/uL (ref 1.4–7.0)
Neutrophils: 67 %
Platelets: 251 10*3/uL (ref 150–450)
RBC: 4.71 x10E6/uL (ref 4.14–5.80)
RDW: 12.7 % (ref 11.6–15.4)
WBC: 6 10*3/uL (ref 3.4–10.8)

## 2022-01-11 LAB — LIPID PANEL W/O CHOL/HDL RATIO
Cholesterol, Total: 195 mg/dL (ref 100–199)
HDL: 56 mg/dL (ref >39)
LDL Chol Calc (NIH): 119 mg/dL — ABNORMAL HIGH (ref 0–99)
Triglycerides: 110 mg/dL (ref 0–149)
VLDL Cholesterol Cal: 20 mg/dL (ref 5–40)

## 2022-01-11 LAB — PSA: Prostate Specific Ag, Serum: 1 ng/mL (ref 0.0–4.0)

## 2022-01-11 LAB — HEMOGLOBIN A1C
Est. average glucose Bld gHb Est-mCnc: 123 mg/dL
Hgb A1c MFr Bld: 5.9 % — ABNORMAL HIGH (ref 4.8–5.6)

## 2022-01-11 LAB — TSH: TSH: 2.81 u[IU]/mL (ref 0.450–4.500)

## 2022-01-11 NOTE — Progress Notes (Signed)
Contacted via MyChart The 10-year ASCVD risk score (Arnett DK, et al., 2019) is: 15.8%   Values used to calculate the score:     Age: 67 years     Sex: Male     Is Non-Hispanic African American: No     Diabetic: No     Tobacco smoker: No     Systolic Blood Pressure: 290 mmHg     Is BP treated: Yes     HDL Cholesterol: 56 mg/dL     Total Cholesterol: 195 mg/dL   Good afternoon Brent Smith, your labs have returned: - Kidney function, creatinine and eGFR, remains normal, as is liver function, AST and ALT.  - A1c in Prediabetic range -- 5.9%, continue diet focus. - Your LDL remains elevated, are you taking Atorvastatin daily?  Let me know as we may need to change things up a bit.  Any questions? Keep being amazing!!  Thank you for allowing me to participate in your care.  I appreciate you. Kindest regards, Jarl Sellitto

## 2022-08-20 NOTE — Patient Instructions (Signed)
Abdominal Pain, Adult  Many things can cause belly (abdominal) pain. In most cases, belly pain is not a serious problem and can be watched and treated at home. But in some cases, it can be serious. Your doctor will try to find the cause of your belly pain. Follow these instructions at home: Medicines Take over-the-counter and prescription medicines only as told by your doctor. Do not take medicines that help you poop (laxatives) unless told by your doctor. General instructions Watch your belly pain for any changes. Tell your doctor if the pain gets worse. Drink enough fluid to keep your pee (urine) pale yellow. Contact a doctor if: Your belly pain changes or gets worse. You have very bad cramping or bloating in your belly. You vomit. Your pain gets worse with meals, after eating, or with certain foods. You have trouble pooping or have watery poop for more than 2-3 days. You are not hungry, or you lose weight without trying. You have signs of not getting enough fluid or water (dehydration). These may include: Dark pee, very little pee, or no pee. Cracked lips or dry mouth. Feeling sleepy or weak. You have pain when you pee or poop. Your belly pain wakes you up at night. You have blood in your pee. You have a fever. Get help right away if: You cannot stop vomiting. Your pain is only in one part of your belly, like on the right side. You have bloody or black poop, or poop that looks like tar. You have trouble breathing. You have chest pain. These symptoms may be an emergency. Get help right away. Call 911. Do not wait to see if the symptoms will go away. Do not drive yourself to the hospital. This information is not intended to replace advice given to you by your health care provider. Make sure you discuss any questions you have with your health care provider. Document Revised: 10/06/2021 Document Reviewed: 10/06/2021 [employer] Patient Education  2024 [employer]  

## 2022-08-22 ENCOUNTER — Ambulatory Visit (INDEPENDENT_AMBULATORY_CARE_PROVIDER_SITE_OTHER): Payer: BC Managed Care – PPO | Admitting: Nurse Practitioner

## 2022-08-22 ENCOUNTER — Encounter: Payer: Self-pay | Admitting: Nurse Practitioner

## 2022-08-22 VITALS — BP 130/79 | HR 90 | Temp 97.3°F | Wt 197.4 lb

## 2022-08-22 DIAGNOSIS — Q740 Other congenital malformations of upper limb(s), including shoulder girdle: Secondary | ICD-10-CM

## 2022-08-22 DIAGNOSIS — R1031 Right lower quadrant pain: Secondary | ICD-10-CM | POA: Insufficient documentation

## 2022-08-22 LAB — URINALYSIS, ROUTINE W REFLEX MICROSCOPIC
Bilirubin, UA: NEGATIVE
Glucose, UA: NEGATIVE
Ketones, UA: NEGATIVE
Nitrite, UA: NEGATIVE
Protein,UA: NEGATIVE
Specific Gravity, UA: 1.01 (ref 1.005–1.030)
Urobilinogen, Ur: 0.2 mg/dL (ref 0.2–1.0)
pH, UA: 6 (ref 5.0–7.5)

## 2022-08-22 LAB — MICROSCOPIC EXAMINATION
Bacteria, UA: NONE SEEN
Epithelial Cells (non renal): NONE SEEN /hpf (ref 0–10)

## 2022-08-22 NOTE — Progress Notes (Signed)
BP 130/79   Pulse 90   Temp (!) 97.3 F (36.3 C) (Oral)   Wt 197 lb 6.4 oz (89.5 kg)   SpO2 97%   BMI 30.02 kg/m    Subjective:    Patient ID: Brent Smith, male    DOB: [birth year], 67 y.o.   MRN: [id number]  HPI: Brent Smith is a 67 y.o. male  Chief Complaint  Patient presents with   Abdominal Pain    Pt states he has been noticing some pain in his lower abdomen for a while now. States he feels the pain more so on the R side.    Cyst    Pt states he has noticed a bulge or a knot on the L collarbone    SKIN CYST Has a bulge to left collarbone that daughter noticed.  Does not know how long it has been there.  He had not noticed it. Duration:  unknown Location: left collarbone Painful: no Itching: no Onset: sudden Context: not changing Associated signs and symptoms: none History of skin cancer: no History of precancerous skin lesions: no Family history of skin cancer: no   ABDOMINAL PAIN  Has been ongoing to lower abdomen for a few months on and off.  Was on the road when first noticed it.  Will go to bathroom, pass gas, and then it will go away.  Has been playing with diet while on the road -- no crackers.  This happens intermittently.  Yesterday it hurt a little bit, none today.  1st episode was the worst.  Last colonoscopy 2018.  Often passes bowels easily.  Has BM every day.  No straining with this.  Still has appendix.   Duration:months Onset: gradual Severity: 4/10 on average Quality: sharp, dull, aching, and throbbing Location:  RLQ - sometimes all across Episode duration: less then one minute Radiation: no Frequency: intermittent Alleviating factors: nothing Aggravating factors: spicy foods at times Status: stable Treatments attempted: none Fever: no Nausea: no Vomiting: no Weight loss: no Decreased appetite: no Diarrhea: no Constipation: no Blood in stool: no Heartburn: takes medications, will have if misses one Jaundice: no Rash:  no Dysuria/urinary frequency: no Hematuria: no History of sexually transmitted disease: no Recurrent NSAID use: no   Relevant past medical, surgical, family and social history reviewed and updated as indicated. Interim medical history since our last visit reviewed. Allergies and medications reviewed and updated.  Review of Systems  Constitutional:  Negative for activity change, diaphoresis, fatigue and fever.  Respiratory:  Negative for cough, chest tightness, shortness of breath and wheezing.   Cardiovascular:  Negative for chest pain, palpitations and leg swelling.  Gastrointestinal:  Positive for abdominal pain. Negative for abdominal distention, blood in stool, constipation, diarrhea, nausea, rectal pain and vomiting.  Neurological: Negative.   Psychiatric/Behavioral: Negative.      Per HPI unless specifically indicated above     Objective:    BP 130/79   Pulse 90   Temp (!) 97.3 F (36.3 C) (Oral)   Wt 197 lb 6.4 oz (89.5 kg)   SpO2 97%   BMI 30.02 kg/m   Wt Readings from Last 3 Encounters:  08/22/22 197 lb 6.4 oz (89.5 kg)  01/10/22 199 lb 14.4 oz (90.7 kg)  01/06/21 190 lb 12.8 oz (86.5 kg)    Physical Exam Vitals and nursing note reviewed.  Constitutional:      General: He is awake. He is not in acute distress.    Appearance: He is  well-developed and well-groomed. He is not ill-appearing or toxic-appearing.  HENT:     Head: Normocephalic.     Right Ear: Hearing and external ear normal.     Left Ear: Hearing and external ear normal.  Eyes:     General: Lids are normal.     Extraocular Movements: Extraocular movements intact.     Conjunctiva/sclera: Conjunctivae normal.  Neck:     Thyroid: No thyromegaly.     Vascular: No carotid bruit.     Comments: Collarbone slightly more prominent to left side vs right.  On assessment he leans upper body more to right side.  No masses felt or tenderness.   Cardiovascular:     Rate and Rhythm: Normal rate and regular  rhythm.     Heart sounds: Normal heart sounds. No murmur heard.    No gallop.  Pulmonary:     Effort: No accessory muscle usage or respiratory distress.     Breath sounds: Normal breath sounds.  Abdominal:     General: Bowel sounds are normal. There is no distension.     Palpations: Abdomen is soft. There is no hepatomegaly.     Tenderness: There is no abdominal tenderness. There is no right CVA tenderness, left CVA tenderness, guarding or rebound. Negative signs include psoas sign and obturator sign.     Hernia: A hernia (no changes in size) is present. Hernia is present in the umbilical area. There is no hernia in the left inguinal area or right inguinal area.  Musculoskeletal:     Cervical back: Full passive range of motion without pain.     Right lower leg: No edema.     Left lower leg: No edema.  Lymphadenopathy:     Cervical: No cervical adenopathy.  Skin:    General: Skin is warm.     Capillary Refill: Capillary refill takes less than 2 seconds.  Neurological:     Mental Status: He is alert and oriented to person, place, and time.     Deep Tendon Reflexes: Reflexes are normal and symmetric.     Reflex Scores:      Brachioradialis reflexes are 2+ on the right side and 2+ on the left side.      Patellar reflexes are 2+ on the right side and 2+ on the left side. Psychiatric:        Attention and Perception: Attention normal.        Mood and Affect: Mood normal.        Speech: Speech normal.        Behavior: Behavior normal. Behavior is cooperative.        Thought Content: Thought content normal.     Results for orders placed or performed in visit on 01/10/22  Microalbumin, Urine Waived  Result Value Ref Range   Microalb, Ur Waived 30 (H) 0 - 19 mg/L   Creatinine, Urine Waived 100 10 - 300 mg/dL   Microalb/Creat Ratio <30 <30 mg/g  CBC with Differential/Platelet  Result Value Ref Range   WBC 6.0 3.4 - 10.8 x10E3/uL   RBC 4.71 4.14 - 5.80 x10E6/uL   Hemoglobin 14.3 13.0  - 17.7 g/dL   Hematocrit 16.1 09.6 - 51.0 %   MCV 92 79 - 97 fL   MCH 30.4 26.6 - 33.0 pg   MCHC 32.9 31.5 - 35.7 g/dL   RDW 04.5 40.9 - 81.1 %   Platelets 251 150 - 450 x10E3/uL   Neutrophils 67 Not Estab. %  Lymphs 21 Not Estab. %   Monocytes 9 Not Estab. %   Eos 2 Not Estab. %   Basos 1 Not Estab. %   Neutrophils Absolute 4.1 1.4 - 7.0 x10E3/uL   Lymphocytes Absolute 1.3 0.7 - 3.1 x10E3/uL   Monocytes Absolute 0.5 0.1 - 0.9 x10E3/uL   EOS (ABSOLUTE) 0.1 0.0 - 0.4 x10E3/uL   Basophils Absolute 0.0 0.0 - 0.2 x10E3/uL   Immature Granulocytes 0 Not Estab. %   Immature Grans (Abs) 0.0 0.0 - 0.1 x10E3/uL  Comprehensive metabolic panel  Result Value Ref Range   Glucose 109 (H) 70 - 99 mg/dL   BUN 15 8 - 27 mg/dL   Creatinine, Ser 4.09 0.76 - 1.27 mg/dL   eGFR 71 >81 XB/JYN/8.29   BUN/Creatinine Ratio 13 10 - 24   Sodium 139 134 - 144 mmol/L   Potassium 4.5 3.5 - 5.2 mmol/L   Chloride 103 96 - 106 mmol/L   CO2 23 20 - 29 mmol/L   Calcium 9.8 8.6 - 10.2 mg/dL   Total Protein 7.3 6.0 - 8.5 g/dL   Albumin 4.6 3.9 - 4.9 g/dL   Globulin, Total 2.7 1.5 - 4.5 g/dL   Albumin/Globulin Ratio 1.7 1.2 - 2.2   Bilirubin Total 0.3 0.0 - 1.2 mg/dL   Alkaline Phosphatase 61 44 - 121 IU/L   AST 15 0 - 40 IU/L   ALT 16 0 - 44 IU/L  Lipid Panel w/o Chol/HDL Ratio  Result Value Ref Range   Cholesterol, Total 195 100 - 199 mg/dL   Triglycerides 562 0 - 149 mg/dL   HDL 56 >13 mg/dL   VLDL Cholesterol Cal 20 5 - 40 mg/dL   LDL Chol Calc (NIH) 086 (H) 0 - 99 mg/dL  TSH  Result Value Ref Range   TSH 2.810 0.450 - 4.500 uIU/mL  PSA  Result Value Ref Range   Prostate Specific Ag, Serum 1.0 0.0 - 4.0 ng/mL  HgB A1c  Result Value Ref Range   Hgb A1c MFr Bld 5.9 (H) 4.8 - 5.6 %   Est. average glucose Bld gHb Est-mCnc 123 mg/dL      Assessment & Plan:   Problem List Items Addressed This Visit       Other   Abnormal prominence of clavicle    To left side, suspect this is more related to  arthritic changes and posture.  No masses felt and no other symptoms present.  Continue to monitor at this time.      Right lower quadrant abdominal pain - Primary    Acute, waxing and waning for 3 months.  ?muscular vs appendix vs gas.  Have recommended he cut back on Ibuprofen use.  UA overall stable with trace blood and leuks, will send for culture.  Check CBC and CMP today.  Discussed with him if labs overall stable then next step would be to get imaging to ensure no issues with appendix present.  Recommend he schedule a colonoscopy, is past due.      Relevant Orders   Urinalysis, Routine w reflex microscopic   CBC with Differential/Platelet   Comprehensive metabolic panel   Urine Culture     Follow up plan: Return in about 4 weeks (around 09/19/2022) for Abdominal pain.

## 2022-08-22 NOTE — Assessment & Plan Note (Signed)
To left side, suspect this is more related to arthritic changes and posture.  No masses felt and no other symptoms present.  Continue to monitor at this time.

## 2022-08-22 NOTE — Assessment & Plan Note (Addendum)
Acute, waxing and waning for 3 months.  ?muscular vs appendix vs gas.  Have recommended he cut back on Ibuprofen use.  UA overall stable with trace blood and leuks, will send for culture.  Check CBC and CMP today.  Discussed with him if labs overall stable then next step would be to get imaging to ensure no issues with appendix present.  Recommend he schedule a colonoscopy, is past due.

## 2022-08-23 LAB — CBC WITH DIFFERENTIAL/PLATELET
Basophils Absolute: 0 10*3/uL (ref 0.0–0.2)
Basos: 1 %
EOS (ABSOLUTE): 0.1 10*3/uL (ref 0.0–0.4)
Eos: 2 %
Hematocrit: 41.9 % (ref 37.5–51.0)
Hemoglobin: 13.7 g/dL (ref 13.0–17.7)
Immature Grans (Abs): 0 10*3/uL (ref 0.0–0.1)
Immature Granulocytes: 0 %
Lymphocytes Absolute: 1.6 10*3/uL (ref 0.7–3.1)
Lymphs: 28 %
MCH: 30.2 pg (ref 26.6–33.0)
MCHC: 32.7 g/dL (ref 31.5–35.7)
MCV: 92 fL (ref 79–97)
Monocytes Absolute: 0.5 10*3/uL (ref 0.1–0.9)
Monocytes: 9 %
Neutrophils Absolute: 3.4 10*3/uL (ref 1.4–7.0)
Neutrophils: 60 %
Platelets: 238 10*3/uL (ref 150–450)
RBC: 4.54 x10E6/uL (ref 4.14–5.80)
RDW: 12.9 % (ref 11.6–15.4)
WBC: 5.7 10*3/uL (ref 3.4–10.8)

## 2022-08-23 LAB — COMPREHENSIVE METABOLIC PANEL
ALT: 14 IU/L (ref 0–44)
AST: 15 IU/L (ref 0–40)
Albumin: 4.4 g/dL (ref 3.9–4.9)
Alkaline Phosphatase: 53 IU/L (ref 44–121)
BUN/Creatinine Ratio: 9 — ABNORMAL LOW (ref 10–24)
BUN: 11 mg/dL (ref 8–27)
Bilirubin Total: 0.4 mg/dL (ref 0.0–1.2)
CO2: 24 mmol/L (ref 20–29)
Calcium: 9.1 mg/dL (ref 8.6–10.2)
Chloride: 104 mmol/L (ref 96–106)
Creatinine, Ser: 1.16 mg/dL (ref 0.76–1.27)
Globulin, Total: 2.5 g/dL (ref 1.5–4.5)
Glucose: 90 mg/dL (ref 70–99)
Potassium: 4.5 mmol/L (ref 3.5–5.2)
Sodium: 140 mmol/L (ref 134–144)
Total Protein: 6.9 g/dL (ref 6.0–8.5)
eGFR: 69 mL/min/{1.73_m2} (ref >59)

## 2022-08-23 NOTE — Progress Notes (Signed)
Contacted via MyChart   Good morning Brent Smith your labs have returned and overall are stable.  Would you like to pursue imaging, let me know?  Keep being awesome!!  Thank you for allowing me to participate in your care.  I appreciate you. Kindest regards, Zack Crager

## 2022-08-24 LAB — URINE CULTURE

## 2022-08-24 NOTE — Progress Notes (Signed)
Contacted via MyChart   No urine infection:)

## 2022-09-23 ENCOUNTER — Ambulatory Visit: Payer: BC Managed Care – PPO | Admitting: Nurse Practitioner

## 2022-09-23 DIAGNOSIS — Z23 Encounter for immunization: Secondary | ICD-10-CM

## 2022-09-23 DIAGNOSIS — R1031 Right lower quadrant pain: Secondary | ICD-10-CM

## 2023-01-13 ENCOUNTER — Encounter: Payer: BC Managed Care – PPO | Admitting: Nurse Practitioner

## 2023-01-13 DIAGNOSIS — K219 Gastro-esophageal reflux disease without esophagitis: Secondary | ICD-10-CM

## 2023-01-13 DIAGNOSIS — N1831 Chronic kidney disease, stage 3a: Secondary | ICD-10-CM

## 2023-01-13 DIAGNOSIS — N400 Enlarged prostate without lower urinary tract symptoms: Secondary | ICD-10-CM

## 2023-01-13 DIAGNOSIS — E7800 Pure hypercholesterolemia, unspecified: Secondary | ICD-10-CM

## 2023-01-13 DIAGNOSIS — Z0000 Encounter for general adult medical examination without abnormal findings: Secondary | ICD-10-CM

## 2023-01-13 DIAGNOSIS — R7301 Impaired fasting glucose: Secondary | ICD-10-CM

## 2023-01-13 DIAGNOSIS — I10 Essential (primary) hypertension: Secondary | ICD-10-CM

## 2023-01-13 DIAGNOSIS — Z23 Encounter for immunization: Secondary | ICD-10-CM

## 2023-02-08 ENCOUNTER — Other Ambulatory Visit: Payer: Self-pay | Admitting: Nurse Practitioner

## 2023-02-09 ENCOUNTER — Other Ambulatory Visit: Payer: Self-pay | Admitting: Nurse Practitioner

## 2023-02-09 NOTE — Telephone Encounter (Signed)
 Requested medications are due for refill today.  yes  Requested medications are on the active medications list.  yes  Last refill. 01/10/2022 #90 4 rf  Future visit scheduled.   yes  Notes to clinic.  Labs are expired.    Requested Prescriptions  Pending Prescriptions Disp Refills   atorvastatin  (LIPITOR) 40 MG tablet [Pharmacy Med Name: ATORVASTATIN  CALCIUM  40 MG TAB] 90 tablet 4    Sig: TAKE 1 TABLET BY MOUTH AT BEDTIME     Cardiovascular:  Antilipid - Statins Failed - 02/09/2023  3:30 PM      Failed - Lipid Panel in normal range within the last 12 months    Cholesterol, Total  Date Value Ref Range Status  01/10/2022 195 100 - 199 mg/dL Final   Cholesterol Piccolo, Waived  Date Value Ref Range Status  04/26/2018 212 (H) <200 mg/dL Final    Comment:                            Desirable                <200                         Borderline High      200- 239                         High                     >239    LDL Chol Calc (NIH)  Date Value Ref Range Status  01/10/2022 119 (H) 0 - 99 mg/dL Final   HDL  Date Value Ref Range Status  01/10/2022 56 >39 mg/dL Final   Triglycerides  Date Value Ref Range Status  01/10/2022 110 0 - 149 mg/dL Final   Triglycerides Piccolo,Waived  Date Value Ref Range Status  04/26/2018 97 <150 mg/dL Final    Comment:                            Normal                   <150                         Borderline High     150 - 199                         High                200 - 499                         Very High                >499          Passed - Patient is not pregnant      Passed - Valid encounter within last 12 months    Recent Outpatient Visits           5 months ago Right lower quadrant abdominal pain   [place] [facility] [place], Melanie T, NP   1 year ago Stage 3a chronic kidney disease [facility])   [place] [facility] [place], [place] T,  NP   2 years ago 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)   [place] [facility] [place], Melanie T, NP   3 years ago Annual physical exam   [place] [facility] [place], [place] T, NP   3 years ago 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   [place] [facility] [place], Melanie DASEN, NP       Future Appointments             In 3 weeks Cannady, Jolene T, NP [place] [facility], [facility]            Signed Prescriptions Disp Refills   omeprazole  (PRILOSEC) 20 MG capsule 90 capsule 0    Sig: TAKE 1 CAPSULE BY MOUTH ONCE DAILY     Gastroenterology: Proton Pump Inhibitors Passed - 02/09/2023  3:30 PM      Passed - Valid encounter within last 12 months    Recent Outpatient Visits           5 months ago Right lower quadrant abdominal pain   [place] [facility] [place], [place] T, NP   1 year ago Stage 3a chronic kidney disease (HCC)   [place] [facility] [place], Melanie T, NP   2 years ago 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 [facility] [place], Melanie T, NP       Future Appointments             In 3 weeks Cannady, Jolene T, NP [place] [facility], [facility]             benazepril  (LOTENSIN ) 40 MG tablet 90 tablet 0    Sig: TAKE 1 TABLET BY MOUTH ONCE DAILY     Cardiovascular:  ACE Inhibitors Passed - 02/09/2023  3:30 PM      Passed - Cr in normal range and within 180 days    Creatinine, Ser  Date Value Ref Range Status  08/22/2022  1.16 0.76 - 1.27 mg/dL Final         Passed - K in normal range and within 180 days    Potassium  Date Value Ref Range Status  08/22/2022 4.5 3.5 - 5.2 mmol/L Final         Passed - Patient is not pregnant      Passed - Last BP in normal range    BP Readings from Last 1 Encounters:  08/22/22 130/79         Passed - Valid encounter within last 6 months    Recent Outpatient Visits           5 months ago Right lower quadrant abdominal pain   [place] [facility] [place], Melanie T, NP   1 year ago Stage 3a chronic kidney disease (HCC)    [facility] [place], Melanie T, NP   2 years ago 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

## 2023-02-09 NOTE — Telephone Encounter (Signed)
 Requested Prescriptions  Pending Prescriptions Disp Refills   atorvastatin  (LIPITOR) 40 MG tablet [Pharmacy Med Name: ATORVASTATIN  CALCIUM  40 MG TAB] 90 tablet 4    Sig: TAKE 1 TABLET BY MOUTH AT BEDTIME     Cardiovascular:  Antilipid - Statins Failed - 02/09/2023  3:29 PM      Failed - Lipid Panel in normal range within the last 12 months    Cholesterol, Total  Date Value Ref Range Status  01/10/2022 195 100 - 199 mg/dL Final   Cholesterol Piccolo, Waived  Date Value Ref Range Status  04/26/2018 212 (H) <200 mg/dL Final    Comment:                            Desirable                <200                         Borderline High      200- 239                         High                     >239    LDL Chol Calc (NIH)  Date Value Ref Range Status  01/10/2022 119 (H) 0 - 99 mg/dL Final   HDL  Date Value Ref Range Status  01/10/2022 56 >39 mg/dL Final   Triglycerides  Date Value Ref Range Status  01/10/2022 110 0 - 149 mg/dL Final   Triglycerides Piccolo,Waived  Date Value Ref Range Status  04/26/2018 97 <150 mg/dL Final    Comment:                            Normal                   <150                         Borderline High     150 - 199                         High                200 - 499                         Very High                >499          Passed - Patient is not pregnant      Passed - Valid encounter within last 12 months    Recent Outpatient Visits           5 months ago Right lower quadrant abdominal pain   [place] [facility] [place], Tehaleh T, NP   1 year ago Stage 3a chronic kidney disease (HCC)   [place] [facility] [place], [place] T, NP   2 years ago 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 (HCC)   [place] [facility] [place], Melanie T, NP   3 years ago Annual physical exam   [place]  [facility] [place], [place] T,  NP   3 years ago 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   [place] [facility] [place], Melanie DASEN, NP       Future Appointments             In 3 weeks Cannady, Jolene T, NP [place] [facility], [facility]             omeprazole  (PRILOSEC) 20 MG capsule [Pharmacy Med Name: OMEPRAZOLE  DR 20 MG CAP] 90 capsule 0    Sig: TAKE 1 CAPSULE BY MOUTH ONCE DAILY     Gastroenterology: Proton Pump Inhibitors Passed - 02/09/2023  3:29 PM      Passed - Valid encounter within last 12 months    Recent Outpatient Visits           5 months ago Right lower quadrant abdominal pain   [place] [facility] [place], [place] T, NP   1 year ago Stage 3a chronic kidney disease (HCC)   [place] [facility] [place], Melanie T, NP   2 years ago 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 (HCC)   [place] [facility] [place], Melanie T, NP   3 years ago Annual physical exam   [place] [facility] [place], [place] T, NP   3 years ago 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   [place] [facility] [place], Melanie DASEN, NP       Future Appointments             In 3 weeks Cannady, Jolene T, NP [place] [facility], [facility]             benazepril  (LOTENSIN ) 40 MG tablet [Pharmacy Med Name: BENAZEPRIL  HCL 40 MG TAB] 90 tablet 0    Sig: TAKE 1 TABLET BY MOUTH ONCE DAILY     Cardiovascular:  ACE Inhibitors Passed - 02/09/2023  3:29 PM      Passed - Cr in normal range and within 180 days    Creatinine, Ser  Date Value Ref Range Status  08/22/2022 1.16 0.76 - 1.27 mg/dL Final         Passed - K in normal range and within 180 days    Potassium  Date Value Ref Range Status  08/22/2022 4.5 3.5 - 5.2 mmol/L Final          Passed - Patient is not pregnant      Passed - Last BP in normal range    BP Readings from Last 1 Encounters:  08/22/22 130/79         Passed - Valid encounter within last 6 months    Recent Outpatient Visits           5 months ago Right lower quadrant abdominal pain   [place] [facility] [place], Melanie T, NP   1 year ago Stage 3a chronic kidney disease (HCC)   [place] [facility] [place], [place] T, NP   2 years ago 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

## 2023-02-10 ENCOUNTER — Other Ambulatory Visit: Payer: Self-pay | Admitting: Nurse Practitioner

## 2023-02-10 MED ORDER — ATORVASTATIN CALCIUM 40 MG PO TABS: ORAL_TABLET | ORAL | 4 refills | Status: DC

## 2023-02-10 NOTE — Telephone Encounter (Signed)
 Requested Prescriptions  Refused Prescriptions Disp Refills   atorvastatin  (LIPITOR) 40 MG tablet [Pharmacy Med Name: ATORVASTATIN  CALCIUM  40 MG TAB] 90 tablet 4    Sig: TAKE 1 TABLET BY MOUTH AT BEDTIME     Cardiovascular:  Antilipid - Statins Failed - 02/10/2023 12:57 PM      Failed - Lipid Panel in normal range within the last 12 months    Cholesterol, Total  Date Value Ref Range Status  01/10/2022 195 100 - 199 mg/dL Final   Cholesterol Piccolo, Waived  Date Value Ref Range Status  04/26/2018 212 (H) <200 mg/dL Final    Comment:                            Desirable                <200                         Borderline High      200- 239                         High                     >239    LDL Chol Calc (NIH)  Date Value Ref Range Status  01/10/2022 119 (H) 0 - 99 mg/dL Final   HDL  Date Value Ref Range Status  01/10/2022 56 >39 mg/dL Final   Triglycerides  Date Value Ref Range Status  01/10/2022 110 0 - 149 mg/dL Final   Triglycerides Piccolo,Waived  Date Value Ref Range Status  04/26/2018 97 <150 mg/dL Final    Comment:                            Normal                   <150                         Borderline High     150 - 199                         High                200 - 499                         Very High                >499          Passed - Patient is not pregnant      Passed - Valid encounter within last 12 months    Recent Outpatient Visits           5 months ago Right lower quadrant abdominal pain   [place] [facility] [place], [place] T, NP   1 year ago Stage 3a chronic kidney disease (HCC)   [place] [facility] [place], [place] T, NP   2 years ago 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

## 2023-02-10 NOTE — Telephone Encounter (Signed)
 Medication Refill -  Most Recent Primary Care Visit:  Provider: CANNADY, JOLENE T  Department: [id number]  Visit Type: OFFICE VISIT  Date: 08/22/2022  Medication: atorvastatin  (LIPITOR) 40 MG tablet   Has the patient contacted their pharmacy? Yes  (Agent: If yes, when and what did the pharmacy advise?) Contact office   Is this the correct pharmacy for this prescription? Yes  This is the patient's preferred pharmacy:  [facility] - [place], [place] - [address] [place] [address] Phone: [phone] Fax: [phone]     Has the prescription been filled recently? Yes  Is the patient out of the medication? Yes  Has the patient been seen for an appointment in the last year OR does the patient have an upcoming appointment? Yes  Can we respond through MyChart? Yes  Agent: Please be advised that Rx refills may take up to 3 business days. We ask that you follow-up with your pharmacy.

## 2023-02-10 NOTE — Telephone Encounter (Signed)
 Mandy called from [facility] and stated that it looks like pt was denied a refill because rx was being refilled to soon. She advised that the last time that this rx was filled was 10/2022. She needs someone to call to claify information.

## 2023-03-03 ENCOUNTER — Encounter: Payer: Self-pay | Admitting: Nurse Practitioner

## 2023-03-03 DIAGNOSIS — I10 Essential (primary) hypertension: Secondary | ICD-10-CM

## 2023-03-03 DIAGNOSIS — K219 Gastro-esophageal reflux disease without esophagitis: Secondary | ICD-10-CM

## 2023-03-03 DIAGNOSIS — E7800 Pure hypercholesterolemia, unspecified: Secondary | ICD-10-CM

## 2023-03-03 DIAGNOSIS — N400 Enlarged prostate without lower urinary tract symptoms: Secondary | ICD-10-CM

## 2023-03-03 DIAGNOSIS — R7301 Impaired fasting glucose: Secondary | ICD-10-CM

## 2023-03-03 DIAGNOSIS — Z0000 Encounter for general adult medical examination without abnormal findings: Secondary | ICD-10-CM

## 2023-03-03 DIAGNOSIS — N1831 Chronic kidney disease, stage 3a: Secondary | ICD-10-CM

## 2023-03-18 NOTE — Patient Instructions (Signed)
 Be Involved in Caring For Your Health:  Taking Medications When medications are taken as directed, they can greatly improve your health. But if they are not taken as prescribed, they may not work. In some cases, not taking them correctly can be harmful. To help ensure your treatment remains effective and safe, understand your medications and how to take them. Bring your medications to each visit for review by your provider.  Your lab results, notes, and after visit summary will be available on My Chart. We strongly encourage you to use this feature. If lab results are abnormal the clinic will contact you with the appropriate steps. If the clinic does not contact you assume the results are satisfactory. You can always view your results on My Chart. If you have questions regarding your health or results, please contact the clinic during office hours. You can also ask questions on My Chart.  We at [facility] are grateful that you chose [place] to provide your care. We strive to provide evidence-based and compassionate care and are always looking for feedback. If you get a survey from the clinic please complete this so we can hear your opinions.  Healthy Eating, Adult Healthy eating may help you get and keep a healthy body weight, reduce the risk of chronic disease, and live a long and productive life. It is important to follow a healthy eating pattern. Your nutritional and calorie needs should be met mainly by different nutrient-rich foods. What are tips for following this plan? Reading food labels Read labels and choose the following: Reduced or low sodium products. Juices with 100% fruit juice. Foods with low saturated fats (<3 g per serving) and high polyunsaturated and monounsaturated fats. Foods with whole grains, such as whole wheat, cracked wheat, brown rice, and wild rice. Whole grains that are fortified with folic acid. This is recommended for females who are pregnant or who want  to become pregnant. Read labels and do not eat or drink the following: Foods or drinks with added sugars. These include foods that contain brown sugar, corn sweetener, corn syrup, dextrose, fructose, glucose, high-fructose corn syrup, honey, invert sugar, lactose, malt syrup, maltose, molasses, raw sugar, sucrose, trehalose, or turbinado sugar. Limit your intake of added sugars to less than 10% of your total daily calories. Do not eat more than the following amounts of added sugar per day: 6 teaspoons (25 g) for females. 9 teaspoons (38 g) for males. Foods that contain processed or refined starches and grains. Refined grain products, such as white flour, degermed cornmeal, white bread, and white rice. Shopping Choose nutrient-rich snacks, such as vegetables, whole fruits, and nuts. Avoid high-calorie and high-sugar snacks, such as potato chips, fruit snacks, and candy. Use oil-based dressings and spreads on foods instead of solid fats such as butter, margarine, sour cream, or cream cheese. Limit pre-made sauces, mixes, and "instant" products such as flavored rice, instant noodles, and ready-made pasta. Try more plant-protein sources, such as tofu, tempeh, black beans, edamame, lentils, nuts, and seeds. Explore eating plans such as the Mediterranean diet or vegetarian diet. Try heart-healthy dips made with beans and healthy fats like hummus and guacamole. Vegetables go great with these. Cooking Use oil to saut or stir-fry foods instead of solid fats such as butter, margarine, or lard. Try baking, boiling, grilling, or broiling instead of frying. Remove the fatty part of meats before cooking. Steam vegetables in water or broth. Meal planning  At meals, imagine dividing your plate into fourths: One-half of  your plate is fruits and vegetables. One-fourth of your plate is whole grains. One-fourth of your plate is protein, especially lean meats, poultry, eggs, tofu, beans, or nuts. Include  low-fat dairy as part of your daily diet. Lifestyle Choose healthy options in all settings, including home, work, school, restaurants, or stores. Prepare your food safely: Wash your hands after handling raw meats. Where you prepare food, keep surfaces clean by regularly washing with hot, soapy water. Keep raw meats separate from ready-to-eat foods, such as fruits and vegetables. Cook seafood, meat, poultry, and eggs to the recommended temperature. Get a food thermometer. Store foods at safe temperatures. In general: Keep cold foods at 76F (4.4C) or below. Keep hot foods at 176F (60C) or above. Keep your freezer at [facility] (-17.8C) or below. Foods are not safe to eat if they have been between the temperatures of 40-176F (4.4-60C) for more than 2 hours. What foods should I eat? Fruits Aim to eat 1-2 cups of fresh, canned (in natural juice), or frozen fruits each day. One cup of fruit equals 1 small apple, 1 large banana, 8 large strawberries, 1 cup (237 g) canned fruit,  cup (82 g) dried fruit, or 1 cup (240 mL) 100% juice. Vegetables Aim to eat 2-4 cups of fresh and frozen vegetables each day, including different varieties and colors. One cup of vegetables equals 1 cup (91 g) broccoli or cauliflower florets, 2 medium carrots, 2 cups (150 g) raw, leafy greens, 1 large tomato, 1 large bell pepper, 1 large sweet potato, or 1 medium white potato. Grains Aim to eat 5-10 ounce-equivalents of whole grains each day. Examples of 1 ounce-equivalent of grains include 1 slice of bread, 1 cup (40 g) ready-to-eat cereal, 3 cups (24 g) popcorn, or  cup (93 g) cooked rice. Meats and other proteins Try to eat 5-7 ounce-equivalents of protein each day. Examples of 1 ounce-equivalent of protein include 1 egg,  oz nuts (12 almonds, 24 pistachios, or 7 walnut halves), 1/4 cup (90 g) cooked beans, 6 tablespoons (90 g) hummus or 1 tablespoon (16 g) peanut butter. A cut of meat or fish that is the size of a deck  of cards is about 3-4 ounce-equivalents (85 g). Of the protein you eat each week, try to have at least 8 sounce (227 g) of seafood. This is about 2 servings per week. This includes salmon, trout, herring, sardines, and anchovies. Dairy Aim to eat 3 cup-equivalents of fat-free or low-fat dairy each day. Examples of 1 cup-equivalent of dairy include 1 cup (240 mL) milk, 8 ounces (250 g) yogurt, 1 ounces (44 g) natural cheese, or 1 cup (240 mL) fortified soy milk. Fats and oils Aim for about 5 teaspoons (21 g) of fats and oils per day. Choose monounsaturated fats, such as canola and olive oils, mayonnaise made with olive oil or avocado oil, avocados, peanut butter, and most nuts, or polyunsaturated fats, such as sunflower, corn, and soybean oils, walnuts, pine nuts, sesame seeds, sunflower seeds, and flaxseed. Beverages Aim for 6 eight-ounce glasses of water per day. Limit coffee to 3-5 eight-ounce cups per day. Limit caffeinated beverages that have added calories, such as soda and energy drinks. If you drink alcohol: Limit how much you have to: 0-1 drink a day if you are male. 0-2 drinks a day if you are male. Know how much alcohol is in your drink. In the [place] one drink is one 12 oz bottle of beer (355 mL), one 5 oz glass of wine (  148 mL), or one 1 oz glass of hard liquor (44 mL). Seasoning and other foods Try not to add too much salt to your food. Try using herbs and spices instead of salt. Try not to add sugar to food. This information is based on [place] nutrition guidelines. To learn more, visit DisposableN[employer].be. Exact amounts may vary. You may need different amounts. This information is not intended to replace advice given to you by your health care provider. Make sure you discuss any questions you have with your health care provider. Document Revised: 09/20/2021 Document Reviewed: 09/20/2021 Elsevier Patient Education  2024 [employer].

## 2023-03-20 ENCOUNTER — Encounter: Payer: Self-pay | Admitting: Nurse Practitioner

## 2023-03-20 ENCOUNTER — Ambulatory Visit (INDEPENDENT_AMBULATORY_CARE_PROVIDER_SITE_OTHER): Payer: BC Managed Care – PPO | Admitting: Nurse Practitioner

## 2023-03-20 VITALS — BP 130/70 | HR 60 | Temp 97.6°F | Ht 68.0 in | Wt 203.2 lb

## 2023-03-20 DIAGNOSIS — N1831 Chronic kidney disease, stage 3a: Secondary | ICD-10-CM

## 2023-03-20 DIAGNOSIS — Z1211 Encounter for screening for malignant neoplasm of colon: Secondary | ICD-10-CM | POA: Diagnosis not present

## 2023-03-20 DIAGNOSIS — I10 Essential (primary) hypertension: Secondary | ICD-10-CM | POA: Diagnosis not present

## 2023-03-20 DIAGNOSIS — K219 Gastro-esophageal reflux disease without esophagitis: Secondary | ICD-10-CM | POA: Diagnosis not present

## 2023-03-20 DIAGNOSIS — R7301 Impaired fasting glucose: Secondary | ICD-10-CM

## 2023-03-20 DIAGNOSIS — Z0000 Encounter for general adult medical examination without abnormal findings: Secondary | ICD-10-CM

## 2023-03-20 DIAGNOSIS — N400 Enlarged prostate without lower urinary tract symptoms: Secondary | ICD-10-CM

## 2023-03-20 DIAGNOSIS — E7800 Pure hypercholesterolemia, unspecified: Secondary | ICD-10-CM

## 2023-03-20 LAB — MICROALBUMIN, URINE WAIVED
Creatinine, Urine Waived: 10 mg/dL (ref 10–300)
Microalb, Ur Waived: 10 mg/L (ref 0–19)

## 2023-03-20 MED ORDER — OMEPRAZOLE 20 MG PO CPDR: 20.0000 mg | DELAYED_RELEASE_CAPSULE | Freq: Every day | ORAL | 4 refills | Status: AC

## 2023-03-20 MED ORDER — ATORVASTATIN CALCIUM 40 MG PO TABS: ORAL_TABLET | ORAL | 4 refills | Status: AC

## 2023-03-20 MED ORDER — BENAZEPRIL HCL 40 MG PO TABS: 40.0000 mg | ORAL_TABLET | Freq: Every day | ORAL | 4 refills | Status: AC

## 2023-03-20 NOTE — Assessment & Plan Note (Signed)
 Chronic, stable. Continue current medication regimen. Will check Magnesium level today. Patient doing well on current medication. Follow up in one year. Refills sent to pharmacy on file.

## 2023-03-20 NOTE — Progress Notes (Deleted)
 BP (!) 142/78   Pulse 60   Temp 97.6 F (36.4 C) (Oral)   Ht 5\' 8"  (1.727 m)   Wt 203 lb 3.2 oz (92.2 kg)   SpO2 98%   BMI 30.90 kg/m    Subjective:    Patient ID: Brent Smith, male    DOB: August 15, 1955, 68 y.o.   MRN: [id number]  HPI: Brent Smith is a 68 y.o. male presenting on 03/20/2023 for comprehensive medical examination. Current medical complaints include:{Blank single:19197::"none","***"}  He currently lives with: wife Interim Problems from his last visit: {Blank single:19197::"yes","no"}  HYPERTENSION / HYPERLIPIDEMIA Takes Atorvastatin and Benazepril.  Continues on Omeprazole for GERD with benefit. Satisfied with current treatment? {Blank single:19197::"yes","no"} Duration of hypertension: {Blank single:19197::"chronic","months","years"} BP monitoring frequency: {Blank single:19197::"not checking","rarely","daily","weekly","monthly","a few times a day","a few times a week","a few times a month"} BP range:  BP medication side effects: {Blank single:19197::"yes","no"} Duration of hyperlipidemia: {Blank single:19197::"chronic","months","years"} Cholesterol medication side effects: {Blank single:19197::"yes","no"} Cholesterol supplements: {Blank multiple:19196::"none","fish oil","niacin","red yeast rice"} Medication compliance: {Blank single:19197::"excellent compliance","good compliance","fair compliance","poor compliance"} Aspirin: {Blank single:19197::"yes","no"} Recent stressors: {Blank single:19197::"yes","no"} Recurrent headaches: {Blank single:19197::"yes","no"} Visual changes: {Blank single:19197::"yes","no"} Palpitations: {Blank single:19197::"yes","no"} Dyspnea: {Blank single:19197::"yes","no"} Chest pain: {Blank single:19197::"yes","no"} Lower extremity edema: {Blank single:19197::"yes","no"} Dizzy/lightheaded: {Blank single:19197::"yes","no"}   Impaired Fasting Glucose HbA1C:  Lab Results  Component Value Date   HGBA1C 5.9 (H) 01/10/2022   Duration of elevated blood sugar:  Polydipsia: {Blank single:19197::"yes","no"} Polyuria: {Blank single:19197::"yes","no"} Weight change: {Blank single:19197::"yes","no"} Visual disturbance: {Blank single:19197::"yes","no"} Glucose Monitoring: {Blank single:19197::"yes","no"}    Accucheck frequency: {Blank single:19197::"Not Checking","Daily","BID","TID"}    Fasting glucose:     Post prandial:  Diabetic Education: {Blank single:19197::"Completed","Not Completed"} Family history of diabetes: {Blank single:19197::"yes","no"}   Functional Status Survey: Is the patient deaf or have difficulty hearing?: No Does the patient have difficulty seeing, even when wearing glasses/contacts?: No Does the patient have difficulty concentrating, remembering, or making decisions?: No Does the patient have difficulty walking or climbing stairs?: No Does the patient have difficulty dressing or bathing?: No Does the patient have difficulty doing errands alone such as visiting a doctor's office or shopping?: No  FALL RISK:    08/22/2022    2:29 PM 01/10/2022   10:01 AM 01/06/2021    9:14 AM 10/28/2019    8:57 AM 10/22/2018    7:57 AM  Fall Risk   Falls in the past year? 0 0 0 0 0  Number falls in past yr: 0 0 0 0 0  Injury with Fall? 0 0 0 0 0  Risk for fall due to : No Fall Risks No Fall Risks No Fall Risks No Fall Risks   Follow up Falls evaluation completed Falls evaluation completed Falls evaluation completed Falls evaluation completed Falls evaluation completed    Depression Screen    08/22/2022    2:29 PM 01/10/2022   10:02 AM 01/06/2021    9:14 AM 10/28/2019    9:03 AM 10/28/2019    8:58 AM  Depression screen PHQ 2/9  Decreased Interest 0 0 0 0 0  Down, Depressed, Hopeless 0 0 0 0 0  PHQ - 2 Score 0 0 0 0 0  Altered sleeping 0 0     Tired, decreased energy 0 0     Change in appetite 0 0     Feeling bad or failure about yourself  0 0     Trouble concentrating 0 0     Moving slowly or  fidgety/restless 0 0     Suicidal thoughts  0 0     PHQ-9 Score 0 0     Difficult doing work/chores Not difficult at all Not difficult at all         08/22/2022    2:30 PM 01/10/2022   10:02 AM  GAD 7 : Generalized Anxiety Score  Nervous, Anxious, on Edge 0 0  Control/stop worrying 0 0  Worry too much - different things 0 0  Trouble relaxing 0 0  Restless 0 0  Easily annoyed or irritable 0 0  Afraid - awful might happen 0 0  Total GAD 7 Score 0 0  Anxiety Difficulty Not difficult at all Not difficult at all   Past Medical History:  Past Medical History:  Diagnosis Date   GERD (gastroesophageal reflux disease)    Hyperlipidemia    Hypertension     Surgical History:  Past Surgical History:  Procedure Laterality Date   SHOULDER SURGERY  2009   TONSILLECTOMY      Medications:  Current Outpatient Medications on File Prior to Visit  Medication Sig   aspirin 81 MG tablet Take 81 mg by mouth daily.   atorvastatin (LIPITOR) 40 MG tablet TAKE 1 TABLET BY MOUTH AT BEDTIME   benazepril (LOTENSIN) 40 MG tablet TAKE 1 TABLET BY MOUTH ONCE DAILY   omeprazole (PRILOSEC) 20 MG capsule TAKE 1 CAPSULE BY MOUTH ONCE DAILY   No current facility-administered medications on file prior to visit.    Allergies:  No Known Allergies  Social History:  Social History   Socioeconomic History   Marital status: Married    Spouse name: Not on file   Number of children: Not on file   Years of education: Not on file   Highest education level: 12th grade  Occupational History   Not on file  Tobacco Use   Smoking status: Former   Smokeless tobacco: Never  Vaping Use   Vaping status: Never Used  Substance and Sexual Activity   Alcohol use: Yes    Comment: pt states on occasion or on vacation   Drug use: No   Sexual activity: Yes  Other Topics Concern   Not on file  Social History Narrative   Not on file   Social Drivers of Health   Financial Resource Strain: Low Risk  (03/16/2023)    Overall Financial Resource Strain (CARDIA)    Difficulty of Paying Living Expenses: Not hard at all  Food Insecurity: No Food Insecurity (03/16/2023)   Hunger Vital Sign    Worried About Running Out of Food in the Last Year: Never true    Ran Out of Food in the Last Year: Never true  Transportation Needs: No Transportation Needs (03/16/2023)   PRAPARE - Administrator, Civil Service (Medical): No    Lack of Transportation (Non-Medical): No  Physical Activity: Sufficiently Active (03/16/2023)   Exercise Vital Sign    Days of Exercise per Week: 7 days    Minutes of Exercise per Session: 30 min  Stress: No Stress Concern Present (03/16/2023)   [employer] of Occupational Health - Occupational Stress Questionnaire    Feeling of Stress : Not at all  Social Connections: Socially Integrated (03/16/2023)   Social Connection and Isolation Panel [NHANES]    Frequency of Communication with Friends and Family: More than three times a week    Frequency of Social Gatherings with Friends and Family: Three times a week    Attends Religious Services: More than 4 times per year  Active Member of Clubs or Organizations: Yes    Attends Banker Meetings: More than 4 times per year    Marital Status: Married  Catering manager Violence: Not At Risk (03/20/2023)   Humiliation, Afraid, Rape, and Kick questionnaire    Fear of Current or Ex-Partner: No    Emotionally Abused: No    Physically Abused: No    Sexually Abused: No   Social History   Tobacco Use  Smoking Status Former  Smokeless Tobacco Never   Social History   Substance and Sexual Activity  Alcohol Use Yes   Comment: pt states on occasion or on vacation    Family History:  Family History  Problem Relation Age of Onset   Hypertension Mother    Parkinson's disease Mother    Hypertension Father    Dementia Father    Hypertension Sister    Hypertension Brother    Hypertension Maternal Grandfather    Past  medical history, surgical history, medications, allergies, family history and social history reviewed with patient today and changes made to appropriate areas of the chart.   ROS All other ROS negative except what is listed above and in the HPI.      Objective:    BP (!) 142/78   Pulse 60   Temp 97.6 F (36.4 C) (Oral)   Ht 5\' 8"  (1.727 m)   Wt 203 lb 3.2 oz (92.2 kg)   SpO2 98%   BMI 30.90 kg/m   Wt Readings from Last 3 Encounters:  03/20/23 203 lb 3.2 oz (92.2 kg)  08/22/22 197 lb 6.4 oz (89.5 kg)  01/10/22 199 lb 14.4 oz (90.7 kg)    Physical Exam     10/22/2018    8:09 AM  6CIT Screen  What Year? 0 points  What month? 0 points  What time? 0 points  Count back from 20 0 points  Months in reverse 0 points  Repeat phrase 0 points  Total Score 0 points   Results for orders placed or performed in visit on 08/22/22  Microscopic Examination   Collection Time: 08/22/22  2:28 PM   Urine  Result Value Ref Range   WBC, UA 0-5 0 - 5 /hpf   RBC, Urine 0-2 0 - 2 /hpf   Epithelial Cells (non renal) None seen 0 - 10 /hpf   Bacteria, UA None seen None seen/Few  Urinalysis, Routine w reflex microscopic   Collection Time: 08/22/22  2:28 PM  Result Value Ref Range   Specific Gravity, UA 1.010 1.005 - 1.030   pH, UA 6.0 5.0 - 7.5   Color, UA Yellow Yellow   Appearance Ur Clear Clear   Leukocytes,UA Trace (A) Negative   Protein,UA Negative Negative/Trace   Glucose, UA Negative Negative   Ketones, UA Negative Negative   RBC, UA Trace (A) Negative   Bilirubin, UA Negative Negative   Urobilinogen, Ur 0.2 0.2 - 1.0 mg/dL   Nitrite, UA Negative Negative   Microscopic Examination See below:   Urine Culture   Collection Time: 08/22/22  2:56 PM   Specimen: Urine   UR  Result Value Ref Range   Urine Culture, Routine Final report    Organism ID, Bacteria Comment   CBC with Differential/Platelet   Collection Time: 08/22/22  2:58 PM  Result Value Ref Range   WBC 5.7 3.4 -  10.8 x10E3/uL   RBC 4.54 4.14 - 5.80 x10E6/uL   Hemoglobin 13.7 13.0 - 17.7 g/dL   Hematocrit  41.9 37.5 - 51.0 %   MCV 92 79 - 97 fL   MCH 30.2 26.6 - 33.0 pg   MCHC 32.7 31.5 - 35.7 g/dL   RDW 40.9 81.1 - 91.4 %   Platelets 238 150 - 450 x10E3/uL   Neutrophils 60 Not Estab. %   Lymphs 28 Not Estab. %   Monocytes 9 Not Estab. %   Eos 2 Not Estab. %   Basos 1 Not Estab. %   Neutrophils Absolute 3.4 1.4 - 7.0 x10E3/uL   Lymphocytes Absolute 1.6 0.7 - 3.1 x10E3/uL   Monocytes Absolute 0.5 0.1 - 0.9 x10E3/uL   EOS (ABSOLUTE) 0.1 0.0 - 0.4 x10E3/uL   Basophils Absolute 0.0 0.0 - 0.2 x10E3/uL   Immature Granulocytes 0 Not Estab. %   Immature Grans (Abs) 0.0 0.0 - 0.1 x10E3/uL  Comprehensive metabolic panel   Collection Time: 08/22/22  2:58 PM  Result Value Ref Range   Glucose 90 70 - 99 mg/dL   BUN 11 8 - 27 mg/dL   Creatinine, Ser 7.82 0.76 - 1.27 mg/dL   eGFR 69 >95 AO/ZHY/8.65   BUN/Creatinine Ratio 9 (L) 10 - 24   Sodium 140 134 - 144 mmol/L   Potassium 4.5 3.5 - 5.2 mmol/L   Chloride 104 96 - 106 mmol/L   CO2 24 20 - 29 mmol/L   Calcium 9.1 8.6 - 10.2 mg/dL   Total Protein 6.9 6.0 - 8.5 g/dL   Albumin 4.4 3.9 - 4.9 g/dL   Globulin, Total 2.5 1.5 - 4.5 g/dL   Bilirubin Total 0.4 0.0 - 1.2 mg/dL   Alkaline Phosphatase 53 44 - 121 IU/L   AST 15 0 - 40 IU/L   ALT 14 0 - 44 IU/L      Assessment & Plan:   Problem List Items Addressed This Visit       Cardiovascular and Mediastinum   Essential hypertension - Primary   Relevant Orders   CBC with Differential/Platelet   Comprehensive metabolic panel   TSH     Digestive   GERD (gastroesophageal reflux disease)   Relevant Orders   Magnesium     Endocrine   Impaired fasting blood sugar   Relevant Orders   Microalbumin, Urine Waived   HgB A1c     Other   Hyperlipidemia   Relevant Orders   Comprehensive metabolic panel   Lipid Panel w/o Chol/HDL Ratio   Other Visit Diagnoses       Benign prostatic  hyperplasia without lower urinary tract symptoms       Relevant Orders   PSA     Encounter for annual physical exam       Annual physical today with labs and health maintenance reviewed, discussed with patient.       Discussed aspirin prophylaxis for myocardial infarction prevention and decision was {Blank single:19197::"it was not indicated","made to continue ASA","made to start ASA","made to stop ASA","that we recommended ASA, and patient refused"}  LABORATORY TESTING:  Health maintenance labs ordered today as discussed above.   The natural history of prostate cancer and ongoing controversy regarding screening and potential treatment outcomes of prostate cancer has been discussed with the patient. The meaning of a false positive PSA and a false negative PSA has been discussed. He indicates understanding of the limitations of this screening test and wishes to proceed with screening PSA testing.  IMMUNIZATIONS:   - Tdap: Tetanus vaccination status reviewed: last tetanus booster within 10 years. - Influenza: Refused - Pneumovax:  Up to date - Prevnar: Up to date - Zostavax vaccine: Refused  SCREENING: - Colonoscopy: {Blank single:19197::"Up to date","Ordered today","Not applicable","Refused","Done elsewhere"}  Discussed with patient purpose of the colonoscopy is to detect colon cancer at curable precancerous or early stages   - AAA Screening: Not applicable  -Hearing Test: Not applicable  -Spirometry: Not applicable   PATIENT COUNSELING:    Sexuality: Discussed sexually transmitted diseases, partner selection, use of condoms, avoidance of unintended pregnancy  and contraceptive alternatives.   Advised to avoid cigarette smoking.  I discussed with the patient that most people either abstain from alcohol or drink within safe limits (<=14/week and <=4 drinks/occasion for males, <=7/weeks and <= 3 drinks/occasion for females) and that the risk for alcohol disorders and other health  effects rises proportionally with the number of drinks per week and how often a drinker exceeds daily limits.  Discussed cessation/primary prevention of drug use and availability of treatment for abuse.   Diet: Encouraged to adjust caloric intake to maintain  or achieve ideal body weight, to reduce intake of dietary saturated fat and total fat, to limit sodium intake by avoiding high sodium foods and not adding table salt, and to maintain adequate dietary potassium and calcium preferably from fresh fruits, vegetables, and low-fat dairy products.    Stressed the importance of regular exercise  Injury prevention: Discussed safety belts, safety helmets, smoke detector, smoking near bedding or upholstery.   Dental health: Discussed importance of regular tooth brushing, flossing, and dental visits.   Follow up plan: NEXT PREVENTATIVE PHYSICAL DUE IN 1 YEAR. No follow-ups on file.

## 2023-03-20 NOTE — Assessment & Plan Note (Signed)
 Check A1c labs today. Continue current plan of care. Follow up in one year.

## 2023-03-20 NOTE — Assessment & Plan Note (Signed)
 Chronic, stable. Continue current medication regimen and adjust as needed. Labs today: CBC, CMP, Magnesium, TSH, PSA, A1c, and Microalbumin. Follow up in one year. Refills sent to pharmacy on file.

## 2023-03-20 NOTE — Progress Notes (Signed)
 BP 130/70   Pulse 60   Temp 97.6 F (36.4 C) (Oral)   Ht 5\' 8"  (1.727 m)   Wt 203 lb 3.2 oz (92.2 kg)   SpO2 98%   BMI 30.90 kg/m    Subjective:    Patient ID: Brent Smith, male    DOB: 24-Dec-1955, 68 y.o.   MRN: [id number]  HPI: Brent Smith is a 68 y.o. male presenting on 03/20/2023 for comprehensive medical examination. Current medical complaints include: Patient states he is feeling good and has no current concerns or complaints.  He currently lives with: wife Interim Problems from his last visit: no  NOTE WRITTEN BY DNP STUDENT.  ASSESSMENT AND PLAN OF CARE REVIEWED WITH STUDENT, AGREE WITH ABOVE FINDINGS AND PLAN.   HYPERTENSION / HYPERLIPIDEMIA Takes Atorvastatin and Benazepril.  Continues on Omeprazole for GERD with benefit. Satisfied with current treatment? yes Duration of hypertension: chronic BP monitoring frequency: not checking BP medication side effects: no Duration of hyperlipidemia: chronic Cholesterol medication side effects: no Cholesterol supplements: none Medication compliance: good compliance Aspirin: yes Recent stressors: no Recurrent headaches: no Visual changes: no Palpitations: no Dyspnea: no Chest pain: no Lower extremity edema: no Dizzy/lightheaded: no   Impaired Fasting Glucose HbA1C:  Lab Results  Component Value Date   HGBA1C 5.9 (H) 01/10/2022  Duration of elevated blood sugar:  Polydipsia: no Polyuria: no Weight change: no Visual disturbance: no Glucose Monitoring: no    Accucheck frequency: Not Checking    Diabetic Education: Completed Family history of diabetes: no   Functional Status Survey: Is the patient deaf or have difficulty hearing?: No Does the patient have difficulty seeing, even when wearing glasses/contacts?: No Does the patient have difficulty concentrating, remembering, or making decisions?: No Does the patient have difficulty walking or climbing stairs?: No Does the patient have difficulty  dressing or bathing?: No Does the patient have difficulty doing errands alone such as visiting a doctor's office or shopping?: No  FALL RISK:    03/20/2023    1:10 PM 08/22/2022    2:29 PM 01/10/2022   10:01 AM 01/06/2021    9:14 AM 10/28/2019    8:57 AM  Fall Risk   Falls in the past year? 0 0 0 0 0  Number falls in past yr: 0 0 0 0 0  Injury with Fall? 0 0 0 0 0  Risk for fall due to : No Fall Risks No Fall Risks No Fall Risks No Fall Risks No Fall Risks  Follow up Falls evaluation completed Falls evaluation completed Falls evaluation completed Falls evaluation completed Falls evaluation completed    Depression Screen    03/20/2023    1:10 PM 08/22/2022    2:29 PM 01/10/2022   10:02 AM 01/06/2021    9:14 AM 10/28/2019    9:03 AM  Depression screen PHQ 2/9  Decreased Interest 0 0 0 0 0  Down, Depressed, Hopeless 0 0 0 0 0  PHQ - 2 Score 0 0 0 0 0  Altered sleeping 0 0 0    Tired, decreased energy 0 0 0    Change in appetite 0 0 0    Feeling bad or failure about yourself  0 0 0    Trouble concentrating 0 0 0    Moving slowly or fidgety/restless 0 0 0    Suicidal thoughts 0 0 0    PHQ-9 Score 0 0 0    Difficult doing work/chores Not difficult at all Not difficult  at all Not difficult at all        03/20/2023    1:10 PM 08/22/2022    2:30 PM 01/10/2022   10:02 AM  GAD 7 : Generalized Anxiety Score  Nervous, Anxious, on Edge 0 0 0  Control/stop worrying 0 0 0  Worry too much - different things 0 0 0  Trouble relaxing 0 0 0  Restless 0 0 0  Easily annoyed or irritable 0 0 0  Afraid - awful might happen 0 0 0  Total GAD 7 Score 0 0 0  Anxiety Difficulty Not difficult at all Not difficult at all Not difficult at all   Past Medical History:  Past Medical History:  Diagnosis Date   GERD (gastroesophageal reflux disease)    Hyperlipidemia    Hypertension     Surgical History:  Past Surgical History:  Procedure Laterality Date   SHOULDER SURGERY  2009   TONSILLECTOMY       Medications:  Current Outpatient Medications on File Prior to Visit  Medication Sig   aspirin 81 MG tablet Take 81 mg by mouth daily.   No current facility-administered medications on file prior to visit.    Allergies:  No Known Allergies  Social History:  Social History   Socioeconomic History   Marital status: Married    Spouse name: Not on file   Number of children: Not on file   Years of education: Not on file   Highest education level: 12th grade  Occupational History   Not on file  Tobacco Use   Smoking status: Former   Smokeless tobacco: Never  Vaping Use   Vaping status: Never Used  Substance and Sexual Activity   Alcohol use: Yes    Comment: pt states on occasion or on vacation   Drug use: No   Sexual activity: Yes  Other Topics Concern   Not on file  Social History Narrative   Not on file   Social Drivers of Health   Financial Resource Strain: Low Risk  (03/16/2023)   Overall Financial Resource Strain (CARDIA)    Difficulty of Paying Living Expenses: Not hard at all  Food Insecurity: No Food Insecurity (03/16/2023)   Hunger Vital Sign    Worried About Running Out of Food in the Last Year: Never true    Ran Out of Food in the Last Year: Never true  Transportation Needs: No Transportation Needs (03/16/2023)   PRAPARE - Administrator, Civil Service (Medical): No    Lack of Transportation (Non-Medical): No  Physical Activity: Sufficiently Active (03/16/2023)   Exercise Vital Sign    Days of Exercise per Week: 7 days    Minutes of Exercise per Session: 30 min  Stress: No Stress Concern Present (03/16/2023)   [employer] of Occupational Health - Occupational Stress Questionnaire    Feeling of Stress : Not at all  Social Connections: Socially Integrated (03/16/2023)   Social Connection and Isolation Panel [NHANES]    Frequency of Communication with Friends and Family: More than three times a week    Frequency of Social Gatherings with  Friends and Family: Three times a week    Attends Religious Services: More than 4 times per year    Active Member of Clubs or Organizations: Yes    Attends Banker Meetings: More than 4 times per year    Marital Status: Married  Catering manager Violence: Not At Risk (03/20/2023)   Humiliation, Afraid, Rape, and Kick  questionnaire    Fear of Current or Ex-Partner: No    Emotionally Abused: No    Physically Abused: No    Sexually Abused: No   Social History   Tobacco Use  Smoking Status Former  Smokeless Tobacco Never   Social History   Substance and Sexual Activity  Alcohol Use Yes   Comment: pt states on occasion or on vacation    Family History:  Family History  Problem Relation Age of Onset   Hypertension Mother    Parkinson's disease Mother    Hypertension Father    Dementia Father    Hypertension Sister    Hypertension Brother    Hypertension Maternal Grandfather    Past medical history, surgical history, medications, allergies, family history and social history reviewed with patient today and changes made to appropriate areas of the chart.   ROS All other ROS negative except what is listed above and in the HPI.      Objective:    BP 130/70   Pulse 60   Temp 97.6 F (36.4 C) (Oral)   Ht 5\' 8"  (1.727 m)   Wt 203 lb 3.2 oz (92.2 kg)   SpO2 98%   BMI 30.90 kg/m   Wt Readings from Last 3 Encounters:  03/20/23 203 lb 3.2 oz (92.2 kg)  08/22/22 197 lb 6.4 oz (89.5 kg)  01/10/22 199 lb 14.4 oz (90.7 kg)    Physical Exam Vitals and nursing note reviewed.  Constitutional:      General: He is not in acute distress.    Appearance: Normal appearance. He is well-groomed and normal weight. He is not ill-appearing.  HENT:     Head: Normocephalic.     Right Ear: Hearing, tympanic membrane, ear canal and external ear normal. There is no impacted cerumen.     Left Ear: Hearing, tympanic membrane, ear canal and external ear normal. There is no impacted  cerumen.     Nose: Nose normal. No congestion.     Mouth/Throat:     Lips: Pink.     Mouth: Mucous membranes are moist.     Pharynx: Oropharynx is clear. Uvula midline. No posterior oropharyngeal erythema.     Tonsils: No tonsillar exudate or tonsillar abscesses.  Eyes:     General: Lids are normal.     Extraocular Movements: Extraocular movements intact.     Right eye: Normal extraocular motion.     Left eye: Normal extraocular motion.     Conjunctiva/sclera: Conjunctivae normal.     Pupils: Pupils are equal, round, and reactive to light.     Funduscopic exam:    Right eye: Red reflex present.        Left eye: Red reflex present. Neck:     Thyroid: No thyroid mass or thyromegaly.     Vascular: No carotid bruit.     Trachea: Trachea normal. No tracheal tenderness.  Cardiovascular:     Rate and Rhythm: Normal rate and regular rhythm.     Pulses: Normal pulses.     Heart sounds: Normal heart sounds. No murmur heard. Pulmonary:     Effort: Pulmonary effort is normal. No respiratory distress.     Breath sounds: Normal breath sounds. No wheezing.  Abdominal:     General: Bowel sounds are normal. There is no distension.     Palpations: Abdomen is soft.     Tenderness: There is no abdominal tenderness.  Musculoskeletal:        General: Normal range of  motion.     Cervical back: Normal range of motion and neck supple. No tenderness.     Right lower leg: No edema.     Left lower leg: No edema.  Lymphadenopathy:     Head:     Right side of head: No tonsillar adenopathy.     Left side of head: No tonsillar adenopathy.     Cervical:     Right cervical: No superficial cervical adenopathy.    Left cervical: No superficial cervical adenopathy.  Skin:    General: Skin is warm and dry.     Findings: No bruising or rash.  Neurological:     General: No focal deficit present.     Mental Status: He is alert and oriented to person, place, and time. Mental status is at baseline.     Motor:  Motor function is intact.     Coordination: Coordination is intact.     Gait: Gait is intact.     Deep Tendon Reflexes: Reflexes are normal and symmetric.  Psychiatric:        Attention and Perception: Attention and perception normal.        Mood and Affect: Mood and affect normal.        Speech: Speech normal.        Behavior: Behavior normal. Behavior is cooperative.        Thought Content: Thought content normal.        Cognition and Memory: Cognition and memory normal.        Judgment: Judgment normal.        10/22/2018    8:09 AM  6CIT Screen  What Year? 0 points  What month? 0 points  What time? 0 points  Count back from 20 0 points  Months in reverse 0 points  Repeat phrase 0 points  Total Score 0 points   Results for orders placed or performed in visit on 08/22/22  Microscopic Examination   Collection Time: 08/22/22  2:28 PM   Urine  Result Value Ref Range   WBC, UA 0-5 0 - 5 /hpf   RBC, Urine 0-2 0 - 2 /hpf   Epithelial Cells (non renal) None seen 0 - 10 /hpf   Bacteria, UA None seen None seen/Few  Urinalysis, Routine w reflex microscopic   Collection Time: 08/22/22  2:28 PM  Result Value Ref Range   Specific Gravity, UA 1.010 1.005 - 1.030   pH, UA 6.0 5.0 - 7.5   Color, UA Yellow Yellow   Appearance Ur Clear Clear   Leukocytes,UA Trace (A) Negative   Protein,UA Negative Negative/Trace   Glucose, UA Negative Negative   Ketones, UA Negative Negative   RBC, UA Trace (A) Negative   Bilirubin, UA Negative Negative   Urobilinogen, Ur 0.2 0.2 - 1.0 mg/dL   Nitrite, UA Negative Negative   Microscopic Examination See below:   Urine Culture   Collection Time: 08/22/22  2:56 PM   Specimen: Urine   UR  Result Value Ref Range   Urine Culture, Routine Final report    Organism ID, Bacteria Comment   CBC with Differential/Platelet   Collection Time: 08/22/22  2:58 PM  Result Value Ref Range   WBC 5.7 3.4 - 10.8 x10E3/uL   RBC 4.54 4.14 - 5.80 x10E6/uL    Hemoglobin 13.7 13.0 - 17.7 g/dL   Hematocrit 24.4 01.0 - 51.0 %   MCV 92 79 - 97 fL   MCH 30.2 26.6 - 33.0 pg  MCHC 32.7 31.5 - 35.7 g/dL   RDW 16.1 09.6 - 04.5 %   Platelets 238 150 - 450 x10E3/uL   Neutrophils 60 Not Estab. %   Lymphs 28 Not Estab. %   Monocytes 9 Not Estab. %   Eos 2 Not Estab. %   Basos 1 Not Estab. %   Neutrophils Absolute 3.4 1.4 - 7.0 x10E3/uL   Lymphocytes Absolute 1.6 0.7 - 3.1 x10E3/uL   Monocytes Absolute 0.5 0.1 - 0.9 x10E3/uL   EOS (ABSOLUTE) 0.1 0.0 - 0.4 x10E3/uL   Basophils Absolute 0.0 0.0 - 0.2 x10E3/uL   Immature Granulocytes 0 Not Estab. %   Immature Grans (Abs) 0.0 0.0 - 0.1 x10E3/uL  Comprehensive metabolic panel   Collection Time: 08/22/22  2:58 PM  Result Value Ref Range   Glucose 90 70 - 99 mg/dL   BUN 11 8 - 27 mg/dL   Creatinine, Ser 4.09 0.76 - 1.27 mg/dL   eGFR 69 >81 XB/JYN/8.29   BUN/Creatinine Ratio 9 (L) 10 - 24   Sodium 140 134 - 144 mmol/L   Potassium 4.5 3.5 - 5.2 mmol/L   Chloride 104 96 - 106 mmol/L   CO2 24 20 - 29 mmol/L   Calcium 9.1 8.6 - 10.2 mg/dL   Total Protein 6.9 6.0 - 8.5 g/dL   Albumin 4.4 3.9 - 4.9 g/dL   Globulin, Total 2.5 1.5 - 4.5 g/dL   Bilirubin Total 0.4 0.0 - 1.2 mg/dL   Alkaline Phosphatase 53 44 - 121 IU/L   AST 15 0 - 40 IU/L   ALT 14 0 - 44 IU/L      Assessment & Plan:   Problem List Items Addressed This Visit       Cardiovascular and Mediastinum   Essential hypertension - Primary   Chronic, stable. Continue current medication regimen and adjust as needed. Labs today: CBC, CMP, Magnesium, TSH, PSA, A1c, and Microalbumin. Follow up in one year. Refills sent to pharmacy on file.      Relevant Medications   atorvastatin (LIPITOR) 40 MG tablet   benazepril (LOTENSIN) 40 MG tablet   Other Relevant Orders   CBC with Differential/Platelet   Comprehensive metabolic panel   TSH     Digestive   GERD (gastroesophageal reflux disease)   Chronic, stable. Continue current medication  regimen. Will check Magnesium level today. Patient doing well on current medication. Follow up in one year. Refills sent to pharmacy on file.      Relevant Medications   omeprazole (PRILOSEC) 20 MG capsule   Other Relevant Orders   Magnesium     Endocrine   Impaired fasting blood sugar   Check A1c labs today. Continue current plan of care. Follow up in one year.      Relevant Orders   Microalbumin, Urine Waived   HgB A1c     Other   Hyperlipidemia   Chronic, stable. Continue current medication regimen and will adjust as needed. Labs today: CBC, CMP, Lipid panel, Magnesium, TSH, PSA, A1c, and Microalbumin. Follow up in one year. Refills sent to pharmacy on file.      Relevant Medications   atorvastatin (LIPITOR) 40 MG tablet   benazepril (LOTENSIN) 40 MG tablet   Other Relevant Orders   Comprehensive metabolic panel   Lipid Panel w/o Chol/HDL Ratio   Other Visit Diagnoses       Benign prostatic hyperplasia without lower urinary tract symptoms       PSA on labs today.  Relevant Orders   PSA     Encounter for annual physical exam       Annual physical today with labs and health maintenance reviewed, discussed with patient.       Discussed aspirin prophylaxis for myocardial infarction prevention and decision was made to continue ASA  LABORATORY TESTING:  Health maintenance labs ordered today as discussed above.   The natural history of prostate cancer and ongoing controversy regarding screening and potential treatment outcomes of prostate cancer has been discussed with the patient. The meaning of a false positive PSA and a false negative PSA has been discussed. He indicates understanding of the limitations of this screening test and wishes to proceed with screening PSA testing.  IMMUNIZATIONS:   - Tdap: Tetanus vaccination status reviewed: last tetanus booster within 10 years. - Influenza: Refused - Pneumovax: Up to date - Prevnar: Up to date - Zostavax vaccine:  Refused  SCREENING: - Colonoscopy: Will think about this Discussed with patient purpose of the colonoscopy is to detect colon cancer at curable precancerous or early stages   - AAA Screening: Not applicable  -Hearing Test: Not applicable  -Spirometry: Not applicable   PATIENT COUNSELING:    Sexuality: Discussed sexually transmitted diseases, partner selection, use of condoms, avoidance of unintended pregnancy  and contraceptive alternatives.   Advised to avoid cigarette smoking.  I discussed with the patient that most people either abstain from alcohol or drink within safe limits (<=14/week and <=4 drinks/occasion for males, <=7/weeks and <= 3 drinks/occasion for females) and that the risk for alcohol disorders and other health effects rises proportionally with the number of drinks per week and how often a drinker exceeds daily limits.  Discussed cessation/primary prevention of drug use and availability of treatment for abuse.   Diet: Encouraged to adjust caloric intake to maintain  or achieve ideal body weight, to reduce intake of dietary saturated fat and total fat, to limit sodium intake by avoiding high sodium foods and not adding table salt, and to maintain adequate dietary potassium and calcium preferably from fresh fruits, vegetables, and low-fat dairy products.    Stressed the importance of regular exercise  Injury prevention: Discussed safety belts, safety helmets, smoke detector, smoking near bedding or upholstery.   Dental health: Discussed importance of regular tooth brushing, flossing, and dental visits.   Follow up plan: NEXT PREVENTATIVE PHYSICAL DUE IN 1 YEAR. Return in about 1 year (around 03/19/2024) for Annual Physical.

## 2023-03-20 NOTE — Addendum Note (Signed)
 Addended by: Aura Dials T on: 03/20/2023 03:30 PM   Modules accepted: Orders

## 2023-03-20 NOTE — Assessment & Plan Note (Signed)
 Chronic, stable. Continue current medication regimen and will adjust as needed. Labs today: CBC, CMP, Lipid panel, Magnesium, TSH, PSA, A1c, and Microalbumin. Follow up in one year. Refills sent to pharmacy on file.

## 2023-03-21 ENCOUNTER — Encounter: Payer: Self-pay | Admitting: Nurse Practitioner

## 2023-03-21 LAB — COMPREHENSIVE METABOLIC PANEL
ALT: 17 IU/L (ref 0–44)
AST: 18 IU/L (ref 0–40)
Albumin: 4.6 g/dL (ref 3.9–4.9)
Alkaline Phosphatase: 61 IU/L (ref 44–121)
BUN/Creatinine Ratio: 12 (ref 10–24)
BUN: 12 mg/dL (ref 8–27)
Bilirubin Total: 0.5 mg/dL (ref 0.0–1.2)
CO2: 22 mmol/L (ref 20–29)
Calcium: 9.5 mg/dL (ref 8.6–10.2)
Chloride: 103 mmol/L (ref 96–106)
Creatinine, Ser: 1.01 mg/dL (ref 0.76–1.27)
Globulin, Total: 2.4 g/dL (ref 1.5–4.5)
Glucose: 90 mg/dL (ref 70–99)
Potassium: 4.5 mmol/L (ref 3.5–5.2)
Sodium: 139 mmol/L (ref 134–144)
Total Protein: 7 g/dL (ref 6.0–8.5)
eGFR: 81 mL/min/{1.73_m2} (ref >59)

## 2023-03-21 LAB — LIPID PANEL W/O CHOL/HDL RATIO
Cholesterol, Total: 158 mg/dL (ref 100–199)
HDL: 50 mg/dL (ref >39)
LDL Chol Calc (NIH): 95 mg/dL (ref 0–99)
Triglycerides: 68 mg/dL (ref 0–149)
VLDL Cholesterol Cal: 13 mg/dL (ref 5–40)

## 2023-03-21 LAB — CBC WITH DIFFERENTIAL/PLATELET
Basophils Absolute: 0 10*3/uL (ref 0.0–0.2)
Basos: 1 %
EOS (ABSOLUTE): 0.1 10*3/uL (ref 0.0–0.4)
Eos: 1 %
Hematocrit: 43.1 % (ref 37.5–51.0)
Hemoglobin: 14.2 g/dL (ref 13.0–17.7)
Immature Grans (Abs): 0 10*3/uL (ref 0.0–0.1)
Immature Granulocytes: 0 %
Lymphocytes Absolute: 1.6 10*3/uL (ref 0.7–3.1)
Lymphs: 28 %
MCH: 30.5 pg (ref 26.6–33.0)
MCHC: 32.9 g/dL (ref 31.5–35.7)
MCV: 93 fL (ref 79–97)
Monocytes Absolute: 0.5 10*3/uL (ref 0.1–0.9)
Monocytes: 9 %
Neutrophils Absolute: 3.5 10*3/uL (ref 1.4–7.0)
Neutrophils: 61 %
Platelets: 233 10*3/uL (ref 150–450)
RBC: 4.66 x10E6/uL (ref 4.14–5.80)
RDW: 12.9 % (ref 11.6–15.4)
WBC: 5.8 10*3/uL (ref 3.4–10.8)

## 2023-03-21 LAB — HEMOGLOBIN A1C
Est. average glucose Bld gHb Est-mCnc: 126 mg/dL
Hgb A1c MFr Bld: 6 % — ABNORMAL HIGH (ref 4.8–5.6)

## 2023-03-21 LAB — MAGNESIUM: Magnesium: 2 mg/dL (ref 1.6–2.3)

## 2023-03-21 LAB — PSA: Prostate Specific Ag, Serum: 2.4 ng/mL (ref 0.0–4.0)

## 2023-03-21 LAB — TSH: TSH: 1.67 u[IU]/mL (ref 0.450–4.500)

## 2023-03-21 NOTE — Progress Notes (Signed)
 Contacted via MyChart   Good evening Brent Smith, your labs have returned: - Kidney function, creatinine and eGFR, remains normal, as is liver function, AST and ALT.  - Thyroid and prostate labs remain normal. - Lipid panel improved this check, continue statin. - A1c continues to show prediabetes, a little trend up to 6%.  Focus heavily on diet and exercise.  Any questions? Keep being amazing!!  Thank you for allowing me to participate in your care.  I appreciate you. Kindest regards, Trevonte Ashkar

## 2023-04-11 ENCOUNTER — Other Ambulatory Visit: Payer: Self-pay | Admitting: Nurse Practitioner

## 2023-04-11 ENCOUNTER — Telehealth: Payer: Self-pay

## 2023-04-11 MED ORDER — SCOPOLAMINE 1 MG/3DAYS TD PT72: 1.0000 | MEDICATED_PATCH | TRANSDERMAL | 0 refills | Status: AC

## 2023-04-11 NOTE — Telephone Encounter (Signed)
 Copied from CRM [id number]. Topic: Clinical - Medication Question >> Apr 11, 2023  9:56 AM [place] J wrote: Reason for CRM: Patient leaves for cruise on friday and would like patches for nausea. Patient stated that a discussion with provider already took place but he never received an update.

## 2023-04-11 NOTE — Telephone Encounter (Signed)
 Copied from CRM [id number]. Topic: Clinical - Medication Refill >> Apr 11, 2023  9:53 AM [place] J wrote: Most Recent Primary Care Visit:  Provider: Aura Dials T  Department: [facility]  Visit Type: PHYSICAL  Date: 03/20/2023  Medication:  aspirin 81 MG tablet   atorvastatin (LIPITOR) 40 MG benazepril (LOTENSIN) 40 MG omeprazole (PRILOSEC) 20 MG  Has the patient contacted their pharmacy? Yes (Agent: If no, request that the patient contact the pharmacy for the refill. If patient does not wish to contact the pharmacy document the reason why and proceed with request.) (Agent: If yes, when and what did the pharmacy advise?) Pharmacy stated that they never received a request for medications originally.  Is this the correct pharmacy for this prescription? Yes If no, delete pharmacy and type the correct one.  This is the patient's preferred pharmacy:  [facility] - [place], [place] - [address] [place] [address] Phone: [phone] Fax: [phone]  Has the prescription been filled recently? No  Is the patient out of the medication? Yes  Has the patient been seen for an appointment in the last year OR does the patient have an upcoming appointment? Yes  Can we respond through MyChart? Yes  Agent: Please be advised that Rx refills may take up to 3 business days. We ask that you follow-up with your pharmacy.

## 2023-04-11 NOTE — Telephone Encounter (Signed)
 Called and notified patient that patches have been sent in for him.

## 2023-04-11 NOTE — Telephone Encounter (Signed)
 Routing to provider to advise and send in medication if appropriate.

## 2023-04-11 NOTE — Telephone Encounter (Signed)
 Requested medication (s) are due for refill today: Yes  Requested medication (s) are on the active medication list: Yes  Last refill:  04/11/23  Future visit scheduled: Yes  Notes to clinic:  Unable to refill due to no refill protocol for this medication.      Requested Prescriptions  Pending Prescriptions Disp Refills   scopolamine (TRANSDERM-SCOP) 1 MG/3DAYS [Pharmacy Med Name: SCOPOLAMINE 1 MG/3DAYS PAT]      Sig: PLACE 1 PATCH ONTO THE SKIN EVERY 3(THREE) DAYS     Off-Protocol Failed - 04/11/2023  3:56 PM      Failed - Medication not assigned to a protocol, review manually.      Passed - Valid encounter within last 12 months    Recent Outpatient Visits           3 weeks ago Essential hypertension   [facility] [place], Brent Rank, Brent Smith

## 2023-04-12 NOTE — Telephone Encounter (Signed)
 Requested medication (s) are due for refill today - no  Requested medication (s) are on the active medication list -yes  Future visit scheduled -yes  Last refill: 03/20/23- for all requested  Notes to clinic: Patient has also requested ASA 81mg - unable to request refill -will need new Rx- all other requested Rx have been sent to pharmacy ( Did not close out due to ASA request- did not want to lose that)  Requested Prescriptions  Pending Prescriptions Disp Refills   atorvastatin (LIPITOR) 40 MG tablet 90 tablet 4    Sig: TAKE 1 TABLET BY MOUTH AT BEDTIME     Cardiovascular:  Antilipid - Statins Failed - 04/12/2023 11:21 AM      Failed - Lipid Panel in normal range within the last 12 months    Cholesterol, Total  Date Value Ref Range Status  03/20/2023 158 100 - 199 mg/dL Final   Cholesterol Piccolo, Waived  Date Value Ref Range Status  04/26/2018 212 (H) <200 mg/dL Final    Comment:                            Desirable                <200                         Borderline High      200- 239                         High                     >239    LDL Chol Calc (NIH)  Date Value Ref Range Status  03/20/2023 95 0 - 99 mg/dL Final   HDL  Date Value Ref Range Status  03/20/2023 50 >39 mg/dL Final   Triglycerides  Date Value Ref Range Status  03/20/2023 68 0 - 149 mg/dL Final   Triglycerides Piccolo,Waived  Date Value Ref Range Status  04/26/2018 97 <150 mg/dL Final    Comment:                            Normal                   <150                         Borderline High     150 - 199                         High                200 - 499                         Very High                >499          Passed - Patient is not pregnant      Passed - Valid encounter within last 12 months    Recent Outpatient Visits           3 weeks ago Essential hypertension   [place] [facility] [place], Dorie Rank, NP  benazepril (LOTENSIN) 40  MG tablet 90 tablet 4    Sig: Take 1 tablet (40 mg total) by mouth daily.     Cardiovascular:  ACE Inhibitors Passed - 04/12/2023 11:21 AM      Passed - Cr in normal range and within 180 days    Creatinine, Ser  Date Value Ref Range Status  03/20/2023 1.01 0.76 - 1.27 mg/dL Final         Passed - K in normal range and within 180 days    Potassium  Date Value Ref Range Status  03/20/2023 4.5 3.5 - 5.2 mmol/L Final         Passed - Patient is not pregnant      Passed - Last BP in normal range    BP Readings from Last 1 Encounters:  03/20/23 130/70         Passed - Valid encounter within last 6 months    Recent Outpatient Visits           3 weeks ago Essential hypertension   [place] [facility] [place], Baileyton T, NP               omeprazole (PRILOSEC) 20 MG capsule 90 capsule 4    Sig: Take 1 capsule (20 mg total) by mouth daily.     Gastroenterology: Proton Pump Inhibitors Passed - 04/12/2023 11:21 AM      Passed - Valid encounter within last 12 months    Recent Outpatient Visits           3 weeks ago Essential hypertension   [place] [facility] [place], Corrie Dandy T, NP                 Requested Prescriptions  Pending Prescriptions Disp Refills   atorvastatin (LIPITOR) 40 MG tablet 90 tablet 4    Sig: TAKE 1 TABLET BY MOUTH AT BEDTIME     Cardiovascular:  Antilipid - Statins Failed - 04/12/2023 11:21 AM      Failed - Lipid Panel in normal range within the last 12 months    Cholesterol, Total  Date Value Ref Range Status  03/20/2023 158 100 - 199 mg/dL Final   Cholesterol Piccolo, Waived  Date Value Ref Range Status  04/26/2018 212 (H) <200 mg/dL Final    Comment:                            Desirable                <200                         Borderline High      200- 239                         High                     >239    LDL Chol Calc (NIH)  Date Value Ref Range Status  03/20/2023 95 0 - 99 mg/dL Final    HDL  Date Value Ref Range Status  03/20/2023 50 >39 mg/dL Final   Triglycerides  Date Value Ref Range Status  03/20/2023 68 0 - 149 mg/dL Final   Triglycerides Piccolo,Waived  Date Value Ref Range Status  04/26/2018 97 <150 mg/dL Final    Comment:  Normal                   <150                         Borderline High     150 - 199                         High                200 - 499                         Very High                >499          Passed - Patient is not pregnant      Passed - Valid encounter within last 12 months    Recent Outpatient Visits           3 weeks ago Essential hypertension   [place] [facility] [place], Jolene T, NP               benazepril (LOTENSIN) 40 MG tablet 90 tablet 4    Sig: Take 1 tablet (40 mg total) by mouth daily.     Cardiovascular:  ACE Inhibitors Passed - 04/12/2023 11:21 AM      Passed - Cr in normal range and within 180 days    Creatinine, Ser  Date Value Ref Range Status  03/20/2023 1.01 0.76 - 1.27 mg/dL Final         Passed - K in normal range and within 180 days    Potassium  Date Value Ref Range Status  03/20/2023 4.5 3.5 - 5.2 mmol/L Final         Passed - Patient is not pregnant      Passed - Last BP in normal range    BP Readings from Last 1 Encounters:  03/20/23 130/70         Passed - Valid encounter within last 6 months    Recent Outpatient Visits           3 weeks ago Essential hypertension   [place] [facility] [place], Lushton T, NP               omeprazole (PRILOSEC) 20 MG capsule 90 capsule 4    Sig: Take 1 capsule (20 mg total) by mouth daily.     Gastroenterology: Proton Pump Inhibitors Passed - 04/12/2023 11:21 AM      Passed - Valid encounter within last 12 months    Recent Outpatient Visits           3 weeks ago Essential hypertension   [facility] [place], Dorie Rank, NP

## 2023-08-29 ENCOUNTER — Ambulatory Visit: Payer: Self-pay

## 2023-08-29 NOTE — Telephone Encounter (Signed)
 FYI Only or Action Required?: FYI only for provider.  Patient was last seen in primary care on 03/20/2023 by Cannady, Jolene T, NP.  Called Nurse Triage reporting No chief complaint on file..  Symptoms began several weeks ago.  Interventions attempted: OTC medications: Ibuprofen.  Symptoms are: gradually worsening.  Triage Disposition: See PCP When Office is Open (Within 3 Days)  Patient/caregiver understands and will follow disposition?: Yes  Copied from CRM #[id number]. Topic: Clinical - Red Word Triage >> Aug 29, 2023  2:49 PM Ivette P wrote: Red Word that prompted transfer to Nurse Triage: alot of back issues. Pan comes and goes. having muscle spasm. consistent than normal. Tolerable  Not available this week, next week Tuesday Reason for Disposition  [1] MODERATE back pain (e.g., interferes with normal activities) AND [2] present > 3 days  Answer Assessment - Initial Assessment Questions 1. ONSET: When did the pain begin? (e.g., minutes, hours, days)     Chronic, more so last few weeks  2. LOCATION: Where does it hurt? (upper, mid or lower back)     Mid to Lower back, Right side  3. SEVERITY: How bad is the pain?  (e.g., Scale 1-10; mild, moderate, or severe)     Mild to Moderate, Back Spasms  4. PATTERN: Is the pain constant? (e.g., yes, no; constant, intermittent)      Intermittent  5. RADIATION: Does the pain shoot into your legs or somewhere else?     From the center of the back to the right side  6. CAUSE:  What do you think is causing the back pain?      Unsure  7. BACK OVERUSE:  Any recent lifting of heavy objects, strenuous work or exercise?     Denies  8. MEDICINES: What have you taken so far for the pain? (e.g., nothing, acetaminophen , NSAIDS)     Ibuprofen, provides relief at 800 mg  9. NEUROLOGIC SYMPTOMS: Do you have any weakness, numbness, or problems with bowel/bladder control?     Denies  10. OTHER SYMPTOMS: Do you have any other  symptoms? (e.g., fever, abdomen pain, burning with urination, blood in urine)       Denies  Protocols used: Back Pain-A-AH

## 2023-08-31 ENCOUNTER — Ambulatory Visit (INDEPENDENT_AMBULATORY_CARE_PROVIDER_SITE_OTHER): Admitting: Nurse Practitioner

## 2023-08-31 ENCOUNTER — Encounter: Payer: Self-pay | Admitting: Nurse Practitioner

## 2023-08-31 VITALS — BP 130/78 | HR 65 | Temp 97.6°F | Wt 203.4 lb

## 2023-08-31 DIAGNOSIS — M545 Low back pain, unspecified: Secondary | ICD-10-CM | POA: Diagnosis not present

## 2023-08-31 MED ORDER — METHYLPREDNISOLONE 4 MG PO TBPK: ORAL_TABLET | ORAL | 0 refills | Status: AC

## 2023-08-31 MED ORDER — TIZANIDINE HCL 2 MG PO TABS: 2.0000 mg | ORAL_TABLET | Freq: Four times a day (QID) | ORAL | 0 refills | Status: AC | PRN

## 2023-08-31 NOTE — Patient Instructions (Signed)
 Acute Back Pain, Adult Acute back pain is sudden and usually short-lived. It is often caused by an injury to the muscles and tissues in the back. The injury may result from: A muscle, tendon, or ligament getting overstretched or torn. Ligaments are tissues that connect bones to each other. Lifting something improperly can cause a back strain. Wear and tear (degeneration) of the spinal disks. Spinal disks are circular tissue that provide cushioning between the bones of the spine (vertebrae). Twisting motions, such as while playing sports or doing yard work. A hit to the back. Arthritis. You may have a physical exam, lab tests, and imaging tests to find the cause of your pain. Acute back pain usually goes away with rest and home care. Follow these instructions at home: Managing pain, stiffness, and swelling Take over-the-counter and prescription medicines only as told by your health care provider. Treatment may include medicines for pain and inflammation that are taken by mouth or applied to the skin, or muscle relaxants. Your health care provider may recommend applying ice during the first 24-48 hours after your pain starts. To do this: Put ice in a plastic bag. Place a towel between your skin and the bag. Leave the ice on for 20 minutes, 2-3 times a day. Remove the ice if your skin turns bright red. This is very important. If you cannot feel pain, heat, or cold, you have a greater risk of damage to the area. If directed, apply heat to the affected area as often as told by your health care provider. Use the heat source that your health care provider recommends, such as a moist heat pack or a heating pad. Place a towel between your skin and the heat source. Leave the heat on for 20-30 minutes. Remove the heat if your skin turns bright red. This is especially important if you are unable to feel pain, heat, or cold. You have a greater risk of getting burned. Activity  Do not stay in bed. Staying in  bed for more than 1-2 days can delay your recovery. Sit up and stand up straight. Avoid leaning forward when you sit or hunching over when you stand. If you work at a desk, sit close to it so you do not need to lean over. Keep your chin tucked in. Keep your neck drawn back, and keep your elbows bent at a 90-degree angle (right angle). Sit high and close to the steering wheel when you drive. Add lower back (lumbar) support to your car seat, if needed. Take short walks on even surfaces as soon as you are able. Try to increase the length of time you walk each day. Do not sit, drive, or stand in one place for more than 30 minutes at a time. Sitting or standing for long periods of time can put stress on your back. Do not drive or use heavy machinery while taking prescription pain medicine. Use proper lifting techniques. When you bend and lift, use positions that put less stress on your back: [place] your knees. Keep the load close to your body. Avoid twisting. Exercise regularly as told by your health care provider. Exercising helps your back heal faster and helps prevent back injuries by keeping muscles strong and flexible. Work with a physical therapist to make a safe exercise program, as recommended by your health care provider. Do any exercises as told by your physical therapist. Lifestyle Maintain a healthy weight. Extra weight puts stress on your back and makes it difficult to have good  posture. Avoid activities or situations that make you feel anxious or stressed. Stress and anxiety increase muscle tension and can make back pain worse. Learn ways to manage anxiety and stress, such as through exercise. General instructions Sleep on a firm mattress in a comfortable position. Try lying on your side with your knees slightly bent. If you lie on your back, put a pillow under your knees. Keep your head and neck in a straight line with your spine (neutral position) when using electronic equipment like  smartphones or pads. To do this: Raise your smartphone or pad to look at it instead of bending your head or neck to look down. Put the smartphone or pad at the level of your face while looking at the screen. Follow your treatment plan as told by your health care provider. This may include: Cognitive or behavioral therapy. Acupuncture or massage therapy. Meditation or yoga. Contact a health care provider if: You have pain that is not relieved with rest or medicine. You have increasing pain going down into your legs or buttocks. Your pain does not improve after 2 weeks. You have pain at night. You lose weight without trying. You have a fever or chills. You develop nausea or vomiting. You develop abdominal pain. Get help right away if: You develop new bowel or bladder control problems. You have unusual weakness or numbness in your arms or legs. You feel faint. These symptoms may represent a serious problem that is an emergency. Do not wait to see if the symptoms will go away. Get medical help right away. Call your local emergency services (911 in the [place] Do not drive yourself to the hospital. Summary Acute back pain is sudden and usually short-lived. Use proper lifting techniques. When you bend and lift, use positions that put less stress on your back. Take over-the-counter and prescription medicines only as told by your health care provider, and apply heat or ice as told. This information is not intended to replace advice given to you by your health care provider. Make sure you discuss any questions you have with your health care provider. Document Revised: 03/13/2020 Document Reviewed: 03/13/2020 Elsevier Patient Education  2024 [employer].
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
## 2023-08-31 NOTE — Assessment & Plan Note (Signed)
 Acute for one month with some worsening, had been doing lots of heavy work.  Suspect this is cause. No red flags on exam.  Will start steroid taper and send in Tizanidine  to take as needed, advised him not to take if working or driving.  Continue Ibuprofen as needed, but recommend no more than twice daily.  Use heating pad as needed and OTC creams. Return to office in two weeks if no improvement or worsening, then would get imaging.

## 2024-01-16 ENCOUNTER — Ambulatory Visit: Payer: Self-pay

## 2024-01-16 DIAGNOSIS — Z860101 Personal history of adenomatous and serrated colon polyps: Secondary | ICD-10-CM | POA: Diagnosis not present

## 2024-01-16 DIAGNOSIS — K640 First degree hemorrhoids: Secondary | ICD-10-CM | POA: Diagnosis not present

## 2024-01-16 DIAGNOSIS — K5730 Diverticulosis of large intestine without perforation or abscess without bleeding: Secondary | ICD-10-CM | POA: Diagnosis not present

## 2024-01-16 DIAGNOSIS — Z09 Encounter for follow-up examination after completed treatment for conditions other than malignant neoplasm: Secondary | ICD-10-CM | POA: Diagnosis present

## 2024-03-25 ENCOUNTER — Encounter: Admitting: Nurse Practitioner
